# Patient Record
Sex: Male | Born: 1970
Health system: Southern US, Community
[De-identification: ages and names within clinical notes are randomized; demographics above are authoritative.]

## PROBLEM LIST (undated history)

## (undated) DIAGNOSIS — E785 Hyperlipidemia, unspecified: Secondary | ICD-10-CM

## (undated) DIAGNOSIS — E119 Type 2 diabetes mellitus without complications: Secondary | ICD-10-CM

## (undated) DIAGNOSIS — E079 Disorder of thyroid, unspecified: Secondary | ICD-10-CM

## (undated) HISTORY — DX: Type 2 diabetes mellitus without complications: E11.9

## (undated) HISTORY — DX: Hyperlipidemia, unspecified: E78.5

## (undated) HISTORY — DX: Disorder of thyroid, unspecified: E07.9

---

## 2008-04-29 ENCOUNTER — Emergency Department (HOSPITAL_COMMUNITY): Admission: EM | Admit: 2008-04-29 | Discharge: 2008-04-29 | Payer: Self-pay | Admitting: Emergency Medicine

## 2008-05-04 ENCOUNTER — Emergency Department (HOSPITAL_COMMUNITY): Admission: EM | Admit: 2008-05-04 | Discharge: 2008-05-04 | Payer: Self-pay | Admitting: Family Medicine

## 2014-08-24 ENCOUNTER — Ambulatory Visit (INDEPENDENT_AMBULATORY_CARE_PROVIDER_SITE_OTHER): Payer: BC Managed Care – PPO | Admitting: Emergency Medicine

## 2014-08-24 VITALS — BP 126/78 | HR 78 | Temp 98.0°F | Resp 16 | Ht 67.0 in | Wt 175.0 lb

## 2014-08-24 DIAGNOSIS — E785 Hyperlipidemia, unspecified: Secondary | ICD-10-CM

## 2014-08-24 DIAGNOSIS — I10 Essential (primary) hypertension: Secondary | ICD-10-CM

## 2014-08-24 DIAGNOSIS — E119 Type 2 diabetes mellitus without complications: Secondary | ICD-10-CM | POA: Diagnosis not present

## 2014-08-24 DIAGNOSIS — Z8639 Personal history of other endocrine, nutritional and metabolic disease: Secondary | ICD-10-CM | POA: Diagnosis not present

## 2014-08-24 DIAGNOSIS — R7309 Other abnormal glucose: Secondary | ICD-10-CM | POA: Diagnosis not present

## 2014-08-24 DIAGNOSIS — Z23 Encounter for immunization: Secondary | ICD-10-CM | POA: Diagnosis not present

## 2014-08-24 LAB — COMPLETE METABOLIC PANEL WITH GFR
ALT: 47 U/L (ref 0–53)
AST: 34 U/L (ref 0–37)
Albumin: 4.7 g/dL (ref 3.5–5.2)
Alkaline Phosphatase: 97 U/L (ref 39–117)
BILIRUBIN TOTAL: 0.7 mg/dL (ref 0.2–1.2)
BUN: 11 mg/dL (ref 6–23)
CO2: 29 mEq/L (ref 19–32)
CREATININE: 0.76 mg/dL (ref 0.50–1.35)
Calcium: 9.9 mg/dL (ref 8.4–10.5)
Chloride: 100 mEq/L (ref 96–112)
GFR, Est African American: >89 mL/min
GFR, Est Non African American: >89 mL/min
Glucose, Bld: 208 mg/dL — ABNORMAL HIGH (ref 70–99)
Potassium: 4.4 mEq/L (ref 3.5–5.3)
Sodium: 138 mEq/L (ref 135–145)
Total Protein: 8.1 g/dL (ref 6.0–8.3)

## 2014-08-24 LAB — TSH: TSH: 3.628 u[IU]/mL (ref 0.350–4.500)

## 2014-08-24 LAB — GLUCOSE, POCT (MANUAL RESULT ENTRY): POC GLUCOSE: 197 mg/dL — AB (ref 70–99)

## 2014-08-24 LAB — LIPID PANEL
Cholesterol: 157 mg/dL (ref 0–200)
HDL: 31 mg/dL — AB (ref >39)
LDL CALC: 72 mg/dL (ref 0–99)
TRIGLYCERIDES: 269 mg/dL — AB (ref <150)
Total CHOL/HDL Ratio: 5.1 Ratio
VLDL: 54 mg/dL — AB (ref 0–40)

## 2014-08-24 LAB — POCT CBC
GRANULOCYTE PERCENT: 59.3 % (ref 37–80)
HCT, POC: 48.7 % (ref 43.5–53.7)
Hemoglobin: 16.1 g/dL (ref 14.1–18.1)
Lymph, poc: 4 — AB (ref 0.6–3.4)
MCH, POC: 26.3 pg — AB (ref 27–31.2)
MCHC: 33.1 g/dL (ref 31.8–35.4)
MCV: 79.6 fL — AB (ref 80–97)
MID (CBC): 0.6 (ref 0–0.9)
MPV: 8.9 fL (ref 0–99.8)
PLATELET COUNT, POC: 223 10*3/uL (ref 142–424)
POC GRANULOCYTE: 6.8 (ref 2–6.9)
POC LYMPH %: 35 % (ref 10–50)
POC MID %: 5.7 %M (ref 0–12)
RBC: 6.12 M/uL (ref 4.69–6.13)
RDW, POC: 12.8 %
WBC: 11.4 10*3/uL — AB (ref 4.6–10.2)

## 2014-08-24 LAB — POCT GLYCOSYLATED HEMOGLOBIN (HGB A1C): HEMOGLOBIN A1C: 9.6

## 2014-08-24 LAB — T4, FREE: Free T4: 1.07 ng/dL (ref 0.80–1.80)

## 2014-08-24 NOTE — Progress Notes (Addendum)
Subjective:  This chart was scribed for Raymond GobbleSteven A Jatasia Gundrum, MD by Raymond Fitzgerald, ED Scribe. The patient was seen in room 12. Patient's care was started at 12:49 PM.   Patient ID: Raymond Fitzgerald, male    DOB: 1971/04/30, 43 y.o.   MRN: [id number]  Chief Complaint  Patient presents with  . Follow-up  . Diabetes   HPI HPI Comments: Raymond Fitzgerald is a 43 y.o. male, with a h/o DM, hyperlipidemia, thyroid disease, who presents to the [facility] for follow-up. Pt [place] Pt is here to establish care. He states that he last had his A1C checked 3-4 months ago. Pt does not recall the results. He was switched from 15 units of insulin to 20 units 3-4 months ago. Pt had a nerve test done a few months ago that was normal. Pt was given Lyrica; stopped it after 1 month due to behavioral changes. Pt does not express any concerns at this time.  Immunizations Pt wishes to have a flu vaccine today.  Preventative Maintenance Pt last had an eye exam done 1.5 years ago.  Past Medical History  Diagnosis Date  . Diabetes mellitus without complication   . Thyroid disease   . Hyperlipidemia    No current outpatient prescriptions on file prior to visit.   No current facility-administered medications on file prior to visit.   Allergies  Allergen Reactions  . Penicillins Hives and Swelling    Review of Systems  Constitutional: Negative for fever and chills.  Eyes: Negative for visual disturbance.  Respiratory: Negative for shortness of breath.   Cardiovascular: Negative for chest pain.  Neurological: Negative for dizziness and light-headedness.      Objective:   Physical Exam CONSTITUTIONAL: Well developed/well nourished HEAD: Normocephalic/atraumatic EYES: EOMI/PERRL ENMT: Mucous membranes moist NECK: supple no meningeal signs SPINE/BACK:entire spine nontender CV: S1/S2 noted, no murmurs/rubs/gallops noted LUNGS: Lungs  are clear to auscultation bilaterally, no apparent distress ABDOMEN: soft, nontender, no rebound or guarding, bowel sounds noted throughout abdomen GU:no cva tenderness NEURO: Pt is awake/alert/appropriate, moves all extremitiesx4.  No facial droop.   EXTREMITIES: pulses normal/equal, full ROM SKIN: warm, color normal PSYCH: no abnormalities of mood noted, alert and oriented to situation    Results for orders placed or performed in visit on 08/24/14  POCT CBC  Result Value Ref Range   WBC 11.4 (A) 4.6 - 10.2 K/uL   Lymph, poc 4.0 (A) 0.6 - 3.4   POC LYMPH PERCENT 35.0 10 - 50 %L   MID (cbc) 0.6 0 - 0.9   POC MID % 5.7 0 - 12 %M   POC Granulocyte 6.8 2 - 6.9   Granulocyte percent 59.3 37 - 80 %G   RBC 6.12 4.69 - 6.13 M/uL   Hemoglobin 16.1 14.1 - 18.1 g/dL   HCT, POC 40.948.7 81.143.5 - 53.7 %   MCV 79.6 (A) 80 - 97 fL   MCH, POC 26.3 (A) 27 - 31.2 pg   MCHC 33.1 31.8 - 35.4 g/dL   RDW, POC 91.412.8 %   Platelet Count, POC 223 142 - 424 K/uL   MPV 8.9 0 - 99.8 fL  POCT glucose (manual entry)  Result Value Ref Range   POC Glucose 197 (A) 70 - 99 mg/dl  POCT glycosylated hemoglobin (Hb A1C)  Result Value Ref Range   Hemoglobin A1C 9.6    Assessment & Plan:  He will have to continue his  oral medications. I advised him to increase his Lantus from 20 units a day to 25 units a day. Referral has been made to endocrinology. I also advised him to establish wit an ophthalmologist.I personally performed the services described in this documentation, which was scribed in my presence. The recorded information has been reviewed and is accurate.I told him we would be happy to refill his medications until he is established with the endocrinologist.

## 2014-08-27 ENCOUNTER — Telehealth: Payer: Self-pay | Admitting: *Deleted

## 2014-09-15 ENCOUNTER — Encounter: Payer: Self-pay | Admitting: Internal Medicine

## 2014-09-15 ENCOUNTER — Ambulatory Visit (INDEPENDENT_AMBULATORY_CARE_PROVIDER_SITE_OTHER): Payer: BC Managed Care – PPO | Admitting: Internal Medicine

## 2014-09-15 VITALS — BP 126/90 | HR 86 | Temp 98.2°F | Resp 14 | Ht 67.0 in | Wt 179.6 lb

## 2014-09-15 DIAGNOSIS — E1165 Type 2 diabetes mellitus with hyperglycemia: Secondary | ICD-10-CM

## 2014-09-15 DIAGNOSIS — IMO0001 Reserved for inherently not codable concepts without codable children: Secondary | ICD-10-CM

## 2014-09-15 MED ORDER — METFORMIN HCL 1000 MG PO TABS: 1000.0000 mg | ORAL_TABLET | Freq: Two times a day (BID) | ORAL | Status: DC

## 2014-09-15 NOTE — Patient Instructions (Signed)
Please increase Metformin 1000 mg 2x a day with meals. Continue Ameryl 2 mg in am. Increase Januvia to100 mg in am. Continue Lantus 25 units at bedtime.  Please return in 1 month with your sugar log.   PATIENT INSTRUCTIONS FOR TYPE 2 DIABETES:  **Please join MyChart!** - see attached instructions about how to join if you have not done so already.  DIET AND EXERCISE Diet and exercise is an important part of diabetic treatment.  We recommended aerobic exercise in the form of brisk walking (working between 40-60% of maximal aerobic capacity, similar to brisk walking) for 150 minutes per week (such as 30 minutes five days per week) along with 3 times per week performing 'resistance' training (using various gauge rubber tubes with handles) 5-10 exercises involving the major muscle groups (upper body, lower body and core) performing 10-15 repetitions (or near fatigue) each exercise. Start at half the above goal but build slowly to reach the above goals. If limited by weight, joint pain, or disability, we recommend daily walking in a swimming pool with water up to waist to reduce pressure from joints while allow for adequate exercise.    BLOOD GLUCOSES Monitoring your blood glucoses is important for continued management of your diabetes. Please check your blood glucoses 2-4 times a day: fasting, before meals and at bedtime (you can rotate these measurements - e.g. one day check before the 3 meals, the next day check before 2 of the meals and before bedtime, etc.).   HYPOGLYCEMIA (low blood sugar) Hypoglycemia is usually a reaction to not eating, exercising, or taking too much insulin/ other diabetes drugs.  Symptoms include tremors, sweating, hunger, confusion, headache, etc. Treat IMMEDIATELY with 15 grams of Carbs: . 4 glucose tablets .  cup regular juice/soda . 2 tablespoons raisins . 4 teaspoons sugar . 1 tablespoon honey Recheck blood glucose in 15 mins and repeat above if still  symptomatic/blood glucose <100.  RECOMMENDATIONS TO REDUCE YOUR RISK OF DIABETIC COMPLICATIONS: * Take your prescribed MEDICATION(S) * Follow a DIABETIC diet: Complex carbs, fiber rich foods, (monounsaturated and polyunsaturated) fats * AVOID saturated/trans fats, high fat foods, >2,300 mg salt per day. * EXERCISE at least 5 times a week for 30 minutes or preferably daily.  * DO NOT SMOKE OR DRINK more than 1 drink a day. * Check your FEET every day. Do not wear tightfitting shoes. Contact us if you develop an ulcer * See your EYE doctor once a year or more if needed * Get a FLU shot once a year * Get a PNEUMONIA vaccine once before and once after age 43 years  GOALS:  * Your Hemoglobin A1c of <7%  * fasting sugars need to be <130 * after meals sugars need to be <180 (2h after you start eating) * Your Systolic BP should be 140 or lower  * Your Diastolic BP should be 80 or lower  * Your HDL (Good Cholesterol) should be 40 or higher  * Your LDL (Bad Cholesterol) should be 100 or lower. * Your Triglycerides should be 150 or lower  * Your Urine microalbumin (kidney function) should be <30 * Your Body Mass Index should be 25 or lower    Please consider the following ways to cut down carbs and fat and increase fiber and micronutrients in your diet: - substitute whole grain for white bread or pasta - substitute brown rice for white rice - substitute 90-calorie flat bread pieces for slices of bread when possible - substitute sweet  potatoes or yams for white potatoes - substitute humus for margarine - substitute tofu for cheese when possible - substitute almond or rice milk for regular milk (would not drink soy milk daily due to concern for soy estrogen influence on breast cancer risk) - substitute dark chocolate for other sweets when possible - substitute water - can add lemon or orange slices for taste - for diet sodas (artificial sweeteners will trick your body that you can eat sweets  without getting calories and will lead you to overeating and weight gain in the long run) - do not skip breakfast or other meals (this will slow down the metabolism and will result in more weight gain over time)  - can try smoothies made from fruit and almond/rice milk in am instead of regular breakfast - can also try old-fashioned (not instant) oatmeal made with almond/rice milk in am - order the dressing on the side when eating salad at a restaurant (pour less than half of the dressing on the salad) - eat as little meat as possible - can try juicing, but should not forget that juicing will get rid of the fiber, so would alternate with eating raw veg./fruits or drinking smoothies - use as little oil as possible, even when using olive oil - can dress a salad with a mix of balsamic vinegar and lemon juice, for e.g. - use agave nectar, stevia sugar, or regular sugar rather than artificial sweateners - steam or broil/roast veggies  - snack on veggies/fruit/nuts (unsalted, preferably) when possible, rather than processed foods - reduce or eliminate aspartame in diet (it is in diet sodas, chewing gum, etc) Read the labels!  Try to read Dr. Janene Harvey book: "Program for Reversing Diabetes" for other ideas for healthy eating.

## 2014-09-15 NOTE — Progress Notes (Signed)
Patient ID: Raymond Fitzgerald, male   DOB: 1971/06/26, 43 y.o.   MRN: [id number]  HPI: Raymond Fitzgerald is a 43 y.o.-year-old male, referred by his PCP, Dr. Cleta Albertsaub, for management of DM2, dx 2011, insulin-dependent since 2014, uncontrolled, without complications. [facility] [place] [facility] [place]  Last hemoglobin A1c was: Lab Results  Component Value Date   HGBA1C 9.6 08/24/2014  05/2014: HbA1c 8.5%  Pt is on a regimen of: - Metformin 850 mg po bid >> tolerates it well - Amaryl 2 mg in am  - Januvia 50 mg daily (for 2-3 years) - Lantus 20 >> 25 units qhs (in last 2 weeks)  Pt does not check his sugars - frustrated b/c all in the 200s - am: n/c - 2h after b'fast: n/c - before lunch: n/c - 2h after lunch: n/c - before dinner: n/c - 2h after dinner: n/c - bedtime: n/c - nighttime: n/c No lows. Lowest sugar was 150; he has hypoglycemia awareness at 70.  Highest sugar was 297 - long time ago.  Pt's meals are: - Breakfast: tea (unsweet) + cookies  - Lunch: tortilla + vegetables, chicken/fish/seafood - Dinner: same - Snacks: 2 : protein bars mid-pm; post dinner: fruit, bar  - no CKD, last BUN/creatinine:  Lab Results  Component Value Date   BUN 11 08/24/2014   CREATININE 0.76 08/24/2014   - last set of lipids: Lab Results  Component Value Date   CHOL 157 08/24/2014   HDL 31* 08/24/2014   LDLCALC 72 08/24/2014   TRIG 269* 08/24/2014   CHOLHDL 5.1 08/24/2014  On Atorvastatin 20. - last eye exam was in 2014. No DR. New appt 10/02/2014. - no numbness and tingling in his feet.  Pt has FH of DM in M, F.  ROS: Constitutional: no weight gain/loss, + increased appetite, no fatigue, no subjective hyperthermia/hypothermia Eyes: no blurry vision, no xerophthalmia ENT: no sore throat, no nodules palpated in throat, no dysphagia/odynophagia, no hoarseness Cardiovascular: no CP/SOB/palpitations/leg swelling Respiratory: no cough/SOB Gastrointestinal: no  N/V/D/C Musculoskeletal: no muscle/joint aches Skin: no rashes Neurological: no tremors/numbness/tingling/dizziness Psychiatric: no depression/anxiety  Past Medical History  Diagnosis Date  . Diabetes mellitus without complication   . Thyroid disease   . Hyperlipidemia    No past surgical history on file. History   Social History  . Marital Status: Married    Spouse Name: N/A    Number of Children: 2   Occupational History  . Sales assistant   Social History Main Topics  . Smoking status: Never Smoker   . Smokeless tobacco: Not on file  . Alcohol Use: Yes  . Drug Use: No   Current Outpatient Prescriptions on File Prior to Visit  Medication Sig Dispense Refill  . atorvastatin (LIPITOR) 20 MG tablet Take 20 mg by mouth daily.    Marland Kitchen. glimepiride (AMARYL) 2 MG tablet Take 2 mg by mouth daily with breakfast.    . levothyroxine (SYNTHROID, LEVOTHROID) 50 MCG tablet Take 50 mcg by mouth daily before breakfast.    . sitaGLIPtin (JANUVIA) 50 MG tablet Take 50 mg by mouth daily.     Metformin 850 mg 2x a day  Allergies  Allergen Reactions  . Penicillins Hives and Swelling   FH: - see HPI - HL in M, F - cancer in M  PE: BP 126/90 mmHg  Pulse 86  Temp(Src) 98.2 F (36.8 C) (Oral)  Resp 14  Ht 5\' 7"  (1.702 m)  Wt 179 lb 9.6 oz (81.466 kg)  BMI 28.12 kg/m2  SpO2 97% Wt Readings from Last 3 Encounters:  09/15/14 179 lb 9.6 oz (81.466 kg)  08/24/14 175 lb (79.379 kg)   Constitutional: overweight, in NAD Eyes: PERRLA, EOMI, no exophthalmos ENT: moist mucous membranes, no thyromegaly, no cervical lymphadenopathy Cardiovascular: RRR, No MRG Respiratory: CTA B Gastrointestinal: abdomen soft, NT, ND, BS+ Musculoskeletal: no deformities, strength intact in all 4 Skin: moist, warm, no rashes Neurological: no tremor with outstretched hands, DTR normal in all 4  ASSESSMENT: 1. DM2, insulin-dependent, uncontrolled, without complications  PLAN:  1. Patient with  uncontrolled diabetes, on oral antidiabetic regimen + basal insulin, which became insufficient - We discussed about options for treatment, but it is difficult to make changes w/o a sugar log...  I suggested to:  Patient Instructions  Please increase Metformin 1000 mg 2x a day with meals. Continue Ameryl 2 mg in am. Increase Januvia to100 mg in am. Continue Lantus 25 units at bedtime.  Please return in 1 month with your sugar log.   - Strongly advised him to start checking sugars at different times of the day - check 2 times a day, rotating checks - given sugar log and advised how to fill it and to bring it at next appt  - given foot care handout and explained the principles  - given instructions for hypoglycemia management "15-15 rule"  - advised for yearly eye exams >> has one schedule - Return to clinic in 1 mo with sugar log

## 2014-10-03 ENCOUNTER — Telehealth: Payer: Self-pay | Admitting: Internal Medicine

## 2014-10-03 ENCOUNTER — Other Ambulatory Visit: Payer: Self-pay | Admitting: *Deleted

## 2014-10-03 MED ORDER — GLIMEPIRIDE 2 MG PO TABS: 2.0000 mg | ORAL_TABLET | Freq: Every day | ORAL | Status: DC

## 2014-10-03 MED ORDER — LEVOTHYROXINE SODIUM 50 MCG PO TABS: 50.0000 ug | ORAL_TABLET | Freq: Every day | ORAL | Status: DC

## 2014-10-03 MED ORDER — ATORVASTATIN CALCIUM 20 MG PO TABS: 20.0000 mg | ORAL_TABLET | Freq: Every day | ORAL | Status: DC

## 2014-10-03 MED ORDER — SITAGLIPTIN PHOSPHATE 50 MG PO TABS: 100.0000 mg | ORAL_TABLET | Freq: Every day | ORAL | Status: DC

## 2014-10-03 MED ORDER — SITAGLIPTIN PHOSPHATE 100 MG PO TABS: 100.0000 mg | ORAL_TABLET | Freq: Every day | ORAL | Status: DC

## 2014-10-03 MED ORDER — METFORMIN HCL 1000 MG PO TABS: 1000.0000 mg | ORAL_TABLET | Freq: Two times a day (BID) | ORAL | Status: DC

## 2014-10-03 NOTE — Telephone Encounter (Signed)
Patient states that due to the cost if he could please have the Januvia as take one 100mg  tab   Pharmacy: [facility]   Please advise   Thank you

## 2014-10-03 NOTE — Telephone Encounter (Signed)
Pt called stating that due to the cost if he could please have the Januvia as take one 100mg  tab. Sent new rx for Januvia 100mg  to pts pharmacy.

## 2014-10-03 NOTE — Telephone Encounter (Signed)
ok 

## 2014-10-03 NOTE — Telephone Encounter (Signed)
Please read note below. Ok to refill all meds?

## 2014-10-03 NOTE — Telephone Encounter (Signed)
Refills sent to [facility] [address].

## 2014-10-03 NOTE — Telephone Encounter (Signed)
New rx for Januvia 100 mg sent to [facility].

## 2014-10-03 NOTE — Telephone Encounter (Signed)
Pt needs all meds called in except the lantus.  [facility] [address]

## 2014-10-03 NOTE — Addendum Note (Signed)
Addended by: Adline MangoALLICUTT, Seanna Sisler B on: 10/03/2014 01:43 PM   Modules accepted: Orders

## 2014-10-10 ENCOUNTER — Telehealth: Payer: Self-pay | Admitting: Internal Medicine

## 2014-10-10 NOTE — Telephone Encounter (Signed)
We are working on the PA

## 2014-10-10 NOTE — Telephone Encounter (Signed)
The RX for Januvia needs PA.  # [phone]

## 2014-10-11 ENCOUNTER — Other Ambulatory Visit: Payer: Self-pay | Admitting: *Deleted

## 2014-10-11 MED ORDER — LINAGLIPTIN 5 MG PO TABS: 5.0000 mg | ORAL_TABLET | Freq: Every day | ORAL | Status: DC

## 2014-10-26 ENCOUNTER — Telehealth: Payer: Self-pay | Admitting: *Deleted

## 2014-10-26 ENCOUNTER — Telehealth: Payer: Self-pay | Admitting: Internal Medicine

## 2014-10-26 ENCOUNTER — Other Ambulatory Visit: Payer: Self-pay | Admitting: *Deleted

## 2014-10-26 ENCOUNTER — Ambulatory Visit: Payer: 59 | Admitting: Internal Medicine

## 2014-10-26 MED ORDER — METFORMIN HCL 1000 MG PO TABS: 1000.0000 mg | ORAL_TABLET | Freq: Two times a day (BID) | ORAL | Status: DC

## 2014-10-26 MED ORDER — GLIMEPIRIDE 2 MG PO TABS: 2.0000 mg | ORAL_TABLET | Freq: Every day | ORAL | Status: DC

## 2014-10-26 NOTE — Telephone Encounter (Signed)
Pt had appt today but has no PCP and we have no auth to see him. He has the new [employer] insurance and this is required. Pt does need all rx refilled the Amaryl is 2 per day. Metformin rx needs to be 2 tabs daily. Doesn't need tradjenta

## 2014-10-26 NOTE — Telephone Encounter (Signed)
Patient stated he went to the pharmacy to pick up his prescription Amaryl it was for 1 tab a day when he normally get 2 tabs a day. Please advise

## 2014-10-26 NOTE — Telephone Encounter (Signed)
Pt needs 90 day rx on lantus, tradjenta, synthroid, lipitor. [facility]. °

## 2014-10-27 MED ORDER — GLIMEPIRIDE 2 MG PO TABS: 4.0000 mg | ORAL_TABLET | Freq: Every day | ORAL | Status: DC

## 2014-10-27 NOTE — Telephone Encounter (Signed)
Please read note below. I looked through pt's chart and I did not see that he was to be taking 2 tabs a day of Amaryl. Please advise.

## 2014-10-27 NOTE — Telephone Encounter (Signed)
Per my records, he was only taking 2 mg a day (1 tab) - is he taking more?? If so, let's call the higher dose in if no lows.

## 2014-10-31 ENCOUNTER — Telehealth: Payer: Self-pay | Admitting: Internal Medicine

## 2014-10-31 ENCOUNTER — Other Ambulatory Visit: Payer: Self-pay | Admitting: *Deleted

## 2014-10-31 MED ORDER — LINAGLIPTIN 5 MG PO TABS: 5.0000 mg | ORAL_TABLET | Freq: Every day | ORAL | Status: DC

## 2014-10-31 MED ORDER — LEVOTHYROXINE SODIUM 50 MCG PO TABS: 50.0000 ug | ORAL_TABLET | Freq: Every day | ORAL | Status: DC

## 2014-10-31 MED ORDER — INSULIN GLARGINE 100 UNIT/ML SOLOSTAR PEN: 25.0000 [IU] | PEN_INJECTOR | Freq: Every day | SUBCUTANEOUS | Status: DC

## 2014-10-31 MED ORDER — ATORVASTATIN CALCIUM 20 MG PO TABS: 20.0000 mg | ORAL_TABLET | Freq: Every day | ORAL | Status: DC

## 2014-10-31 NOTE — Telephone Encounter (Signed)
Patient states that 4 of his Rx's have not been called into the pharmacy  Synthroid Metformin Lantus  Lipitor  Pharmacy: [facility]   Please advise   Thank you

## 2014-10-31 NOTE — Telephone Encounter (Signed)
Done

## 2014-10-31 NOTE — Telephone Encounter (Signed)
Please advise if ok to refill for 90 days.

## 2014-10-31 NOTE — Telephone Encounter (Signed)
Pt needs 90 day rx on lantus, tradjenta, synthroid, lipitor. [facility].

## 2014-10-31 NOTE — Telephone Encounter (Signed)
Message sent to Dr Elvera LennoxGherghe to see if she will ok refills of his medications.

## 2014-10-31 NOTE — Telephone Encounter (Signed)
OK, but with 0 refills.

## 2014-12-13 ENCOUNTER — Ambulatory Visit: Payer: 59 | Admitting: Internal Medicine

## 2015-01-11 ENCOUNTER — Ambulatory Visit (INDEPENDENT_AMBULATORY_CARE_PROVIDER_SITE_OTHER): Payer: 59 | Admitting: Internal Medicine

## 2015-01-11 ENCOUNTER — Other Ambulatory Visit: Payer: Self-pay | Admitting: *Deleted

## 2015-01-11 ENCOUNTER — Encounter: Payer: Self-pay | Admitting: Internal Medicine

## 2015-01-11 VITALS — BP 112/62 | HR 84 | Temp 98.2°F | Resp 12 | Wt 178.0 lb

## 2015-01-11 DIAGNOSIS — R21 Rash and other nonspecific skin eruption: Secondary | ICD-10-CM

## 2015-01-11 DIAGNOSIS — E1165 Type 2 diabetes mellitus with hyperglycemia: Secondary | ICD-10-CM

## 2015-01-11 DIAGNOSIS — IMO0001 Reserved for inherently not codable concepts without codable children: Secondary | ICD-10-CM

## 2015-01-11 MED ORDER — LINAGLIPTIN 5 MG PO TABS: 5.0000 mg | ORAL_TABLET | Freq: Every day | ORAL | Status: DC

## 2015-01-11 MED ORDER — METFORMIN HCL 1000 MG PO TABS: 1000.0000 mg | ORAL_TABLET | Freq: Two times a day (BID) | ORAL | Status: DC

## 2015-01-11 MED ORDER — INSULIN PEN NEEDLE 32G X 4 MM MISC
Status: DC
Start: 2015-01-11 — End: 2015-08-16

## 2015-01-11 MED ORDER — GLIMEPIRIDE 2 MG PO TABS: 2.0000 mg | ORAL_TABLET | Freq: Two times a day (BID) | ORAL | Status: DC

## 2015-01-11 MED ORDER — ONETOUCH DELICA LANCETS FINE MISC
Status: DC
Start: 2015-01-11 — End: 2015-10-03

## 2015-01-11 MED ORDER — INSULIN GLARGINE 300 UNIT/ML ~~LOC~~ SOPN: 30.0000 [IU] | PEN_INJECTOR | Freq: Every day | SUBCUTANEOUS | Status: DC

## 2015-01-11 MED ORDER — GLUCOSE BLOOD VI STRP
ORAL_STRIP | Status: DC
Start: 2015-01-11 — End: 2015-08-16

## 2015-01-11 NOTE — Patient Instructions (Addendum)
Patient Instructions  Please continue Metformin 1000 mg 2x a day with meals. Move Amaryl 2 mg 2x a day to15 min before meals  Continue Tradjenta 5 mg in am. Stop Lantus and start Toujeo 30 units at bedtime.  Please return in 1.5 month with your sugar log.

## 2015-01-11 NOTE — Progress Notes (Signed)
Patient ID: Raymond Fitzgerald, male   DOB: [birth year], 44 y.o.   MRN: [id number]  HPI: Raymond Fitzgerald is a 44 y.o.-year-old male, initially referred by his PCP, Dr. Cleta Alberts, for management of DM2, dx 2011, insulin-dependent since 2014, uncontrolled, without complications. He recently moved from [facility] [place] ([place]). New PCP: Dr. Pershing Proud. Last visit 4 mo ago.  He started to exercise 10-15 min in am and in the evening: elliptical.  Last hemoglobin A1c was: 10/2014: HbA1c 9.5% Lab Results  Component Value Date   HGBA1C 9.6 08/24/2014  05/2014: HbA1c 8.5%  Pt is on a regimen of: - Metformin 850 >> 1000 mg po bid >> tolerates it well - Amaryl 2 mg 2x a day - after meals! - Januvia 50 >> 100 mg daily (for 2-3 years) >> Tradjenta 5 mg in am - Lantus 25 units qhs   Pt checks his sugars 0-2x a day: - am: n/c >> 143-195 - 2h after b'fast: n/c >> 278 - before lunch: n/c >> 183, 184 - 2h after lunch: n/c >> 183 - before dinner: n/c >> 126-216, 239 - 2h after dinner: n/c >> 200-211 - bedtime: n/c - nighttime: n/c No lows. Lowest sugar was 150 >> 143; he has hypoglycemia awareness at 70.  Highest sugar was 297 >> 278x1  Meter: GE (not covered by [facility])  Pt's meals are: - Breakfast: tea (unsweet) + cookies  - Lunch: tortilla + vegetables, chicken/fish/seafood - Dinner: same - Snacks: 2 : protein bars mid-pm; post dinner: fruit, bar  - no CKD, last BUN/creatinine:  Lab Results  Component Value Date   BUN 11 08/24/2014   CREATININE 0.76 08/24/2014   - last set of lipids: Lab Results  Component Value Date   CHOL 157 08/24/2014   HDL 31* 08/24/2014   LDLCALC 72 08/24/2014   TRIG 269* 08/24/2014   CHOLHDL 5.1 08/24/2014  On Atorvastatin 20. - last eye exam was in 2014. No DR. New appt 10/02/2014. - no numbness and tingling in his feet.  He has hypothyroidism.  Last TSH: 4.54. He is on LT4 50 mcg: - in am - fasting - with water - 30 min before breakfast - takes Centrum at  night  ROS: Constitutional: no weight gain/loss, no increased appetite, no fatigue, no subjective hyperthermia/hypothermia Eyes: no blurry vision, no xerophthalmia ENT: no sore throat, no nodules palpated in throat, no dysphagia/odynophagia, no hoarseness Cardiovascular: no CP/SOB/palpitations/leg swelling Respiratory: no cough/SOB Gastrointestinal: no N/V/D/C Musculoskeletal: no muscle/joint aches Skin: + rash on arms, itching Neurological: no tremors/numbness/tingling/dizziness  I reviewed pt's medications, allergies, PMH, social hx, family hx, and changes were documented in the history of present illness. Otherwise, unchanged from my initial visit note:  Past Medical History  Diagnosis Date  . Diabetes mellitus without complication   . Thyroid disease   . Hyperlipidemia    No past surgical history on file. History   Social History  . Marital Status: Married    Spouse Name: N/A    Number of Children: 2   Occupational History  . Sales assistant   Social History Main Topics  . Smoking status: Never Smoker   . Smokeless tobacco: Not on file  . Alcohol Use: Yes  . Drug Use: No   Current Outpatient Prescriptions on File Prior to Visit  Medication Sig Dispense Refill  . atorvastatin (LIPITOR) 20 MG tablet Take 20 mg by mouth daily.    Marland Kitchen glimepiride (AMARYL) 2 MG tablet Take 2 mg by mouth daily with breakfast.    .  levothyroxine (SYNTHROID, LEVOTHROID) 50 MCG tablet Take 50 mcg by mouth daily before breakfast.    . sitaGLIPtin (JANUVIA) 50 MG tablet Take 50 mg by mouth daily.     Metformin 850 mg 2x a day  Allergies  Allergen Reactions  . Penicillins Hives and Swelling   FH: - see HPI - HL in M, F - cancer in M  PE: BP 112/62 mmHg  Pulse 84  Temp(Src) 98.2 F (36.8 C) (Oral)  Resp 12  Wt 178 lb (80.74 kg)  SpO2 97% Wt Readings from Last 3 Encounters:  01/11/15 178 lb (80.74 kg)  09/15/14 179 lb 9.6 oz (81.466 kg)  08/24/14 175 lb (79.379 kg)    Constitutional: overweight, in NAD Eyes: PERRLA, EOMI, no exophthalmos ENT: moist mucous membranes, no thyromegaly, no cervical lymphadenopathy Cardiovascular: RRR, No MRG Respiratory: CTA B Gastrointestinal: abdomen soft, NT, ND, BS+ Musculoskeletal: no deformities, strength intact in all 4 Skin: moist, warm, + papular rash on upper arms - + excoriation from scratching, ~ 1.5 cm in size Neurological: no tremor with outstretched hands, DTR normal in all 4  ASSESSMENT: 1. DM2, insulin-dependent, uncontrolled, without complications  2. Rash on arms  PLAN:  1. Patient with uncontrolled diabetes, on oral antidiabetic regimen + basal insulin, with still suboptimal control. He is taking the Amaryl after b'fast and at bedtime >> advised to move it before meals. I also suggested to increase Lantus by 5 units and switch to Toujeo - given coupon card:  Patient Instructions  Please continue Metformin 1000 mg 2x a day with meals. Move Amaryl 2 mg 2x a day to15 min before meals  Continue Tradjenta 5 mg in am. Stop Lantus and start Toujeo 30 units at bedtime.  Please return in 1.5 month with your sugar log.   - continue checking sugars at different times of the day - check 2 times a day, rotating checks - given new meter covered by insurance - [employer] - refilled all DM meds - advised for yearly eye exams >> he is UTD - given more sugar log sheets - Return to clinic in 1.5 mo with sugar log   2. Rash - on upper arms - tried Nystatin cream >> did not help - tried coconut oil >> helped with itching - recommended Anti-itch - if not resolving >> may need derm referral  Did not stop at the lab for a HbA1c >> will check at next visit.

## 2015-02-22 ENCOUNTER — Ambulatory Visit: Payer: Self-pay | Admitting: Internal Medicine

## 2015-03-14 ENCOUNTER — Ambulatory Visit (INDEPENDENT_AMBULATORY_CARE_PROVIDER_SITE_OTHER): Payer: 59 | Admitting: Internal Medicine

## 2015-03-14 ENCOUNTER — Encounter: Payer: Self-pay | Admitting: Internal Medicine

## 2015-03-14 VITALS — BP 114/70 | HR 92 | Temp 98.1°F | Resp 12 | Wt 179.0 lb

## 2015-03-14 DIAGNOSIS — E1165 Type 2 diabetes mellitus with hyperglycemia: Secondary | ICD-10-CM

## 2015-03-14 DIAGNOSIS — IMO0001 Reserved for inherently not codable concepts without codable children: Secondary | ICD-10-CM

## 2015-03-14 LAB — HEMOGLOBIN A1C: HEMOGLOBIN A1C: 8.6 % — AB (ref 4.6–6.5)

## 2015-03-14 MED ORDER — GLIPIZIDE 5 MG PO TABS: 5.0000 mg | ORAL_TABLET | Freq: Two times a day (BID) | ORAL | Status: DC

## 2015-03-14 MED ORDER — INSULIN GLARGINE 300 UNIT/ML ~~LOC~~ SOPN: 30.0000 [IU] | PEN_INJECTOR | Freq: Every day | SUBCUTANEOUS | Status: DC

## 2015-03-14 MED ORDER — METFORMIN HCL 1000 MG PO TABS: 1000.0000 mg | ORAL_TABLET | Freq: Two times a day (BID) | ORAL | Status: DC

## 2015-03-14 MED ORDER — ATORVASTATIN CALCIUM 20 MG PO TABS: 20.0000 mg | ORAL_TABLET | Freq: Every day | ORAL | Status: DC

## 2015-03-14 MED ORDER — DULAGLUTIDE 1.5 MG/0.5ML ~~LOC~~ SOAJ: SUBCUTANEOUS | Status: DC

## 2015-03-14 NOTE — Progress Notes (Signed)
Patient ID: Raymond Fitzgerald, male   DOB: [birth year], 44 y.o.   MRN: [id number]  HPI: Raymond Fitzgerald is a 44 y.o.-year-old male, initially referred by his PCP, Dr. Cleta Albertsaub, for management of DM2, dx 2011, insulin-dependent since 2014, uncontrolled, without complications. [place] ([place]). New PCP: Dr. Pershing ProudWilliam Dwight. Last visit 2 mo ago.  Last hemoglobin A1c was: 10/2014: HbA1c 9.5% Lab Results  Component Value Date   HGBA1C 9.6 08/24/2014  05/2014: HbA1c 8.5%  Pt is on a regimen of: - Metformin 850 >> 1000 mg po bid >> tolerates it well - Amaryl 2 mg 2x a day - Januvia 50 >> 100 mg daily (for 2-3 years) >> Tradjenta 5 mg in am - Lantus 25 >> 30 units qhs. Did not switch to Toujeo  Pt checks his sugars 0-2x a day - he tells me they are better, but per log, they are higher: - am: n/c >> 143-195 >> 130-190 - 2h after b'fast: n/c >> 278 >> 203 - before lunch: n/c >> 183, 184 >> 236 - 2h after lunch: n/c >> 183 >> 201 - before dinner: n/c >> 126-216, 239 >> 157-231 - 2h after dinner: n/c >> 200-211 >> 230-250 - bedtime: n/c - nighttime: n/c No lows. Lowest sugar was 150 >> 143; he has hypoglycemia awareness at 70.  Highest sugar was 297 >> 278x1>> 230-250 with carbs.  Meter: GE ([facility]  Started walking.   Pt's meals are: - Breakfast: tea (unsweet) + cookies  - Lunch: tortilla + vegetables, chicken/fish/seafood - Dinner: same - Snacks: 2 : protein bars mid-pm; post dinner: fruit, bar  - no CKD, last BUN/creatinine:  Lab Results  Component Value Date   BUN 11 08/24/2014   CREATININE 0.76 08/24/2014   - last set of lipids: Lab Results  Component Value Date   CHOL 157 08/24/2014   HDL 31* 08/24/2014   LDLCALC 72 08/24/2014   TRIG 269* 08/24/2014   CHOLHDL 5.1 08/24/2014  On Atorvastatin 20. - last eye exam was in 2014. No DR. New appt 10/02/2014. - no numbness and tingling in his feet.  He has hypothyroidism.  Last TSH: 4.54. He  is on LT4 50 mcg: - in am - fasting - with water - 30 min before breakfast - takes Centrum at night  ROS: Constitutional: no weight gain/loss, no increased appetite, no fatigue, no subjective hyperthermia/hypothermia Eyes: no blurry vision, no xerophthalmia ENT: no sore throat, no nodules palpated in throat, no dysphagia/odynophagia, no hoarseness Cardiovascular: no CP/SOB/palpitations/leg swelling Respiratory: no cough/SOB Gastrointestinal: no N/V/D/C Musculoskeletal: no muscle/joint aches Skin: + rash on arms, itching Neurological: no tremors/numbness/tingling/dizziness  I reviewed pt's medications, allergies, PMH, social hx, family hx, and changes were documented in the history of present illness. Otherwise, unchanged from my initial visit note:  Past Medical History  Diagnosis Date  . Diabetes mellitus without complication   . Thyroid disease   . Hyperlipidemia    No past surgical history on file. History   Social History  . Marital Status: Married    Spouse Name: N/A    Number of Children: 2   Occupational History  . Sales assistant   Social History Main Topics  . Smoking status: Never Smoker   . Smokeless tobacco: Not on file  . Alcohol Use: Yes  . Drug Use: No   Current Outpatient Prescriptions on File Prior to Visit  Medication Sig Dispense Refill  . atorvastatin (LIPITOR) 20 MG tablet Take 20  mg by mouth daily.    Marland Kitchen glimepiride (AMARYL) 2 MG tablet Take 2 mg by mouth daily with breakfast.    . levothyroxine (SYNTHROID, LEVOTHROID) 50 MCG tablet Take 50 mcg by mouth daily before breakfast.    . sitaGLIPtin (JANUVIA) 50 MG tablet Take 50 mg by mouth daily.     Metformin 850 mg 2x a day  Allergies  Allergen Reactions  . Penicillins Hives and Swelling   FH: - see HPI - HL in M, F - cancer in M  PE: BP 114/70 mmHg  Pulse 92  Temp(Src) 98.1 F (36.7 C) (Oral)  Resp 12  Wt 179 lb (81.194 kg)  SpO2 97% Wt Readings from Last 3 Encounters:   03/14/15 179 lb (81.194 kg)  01/11/15 178 lb (80.74 kg)  09/15/14 179 lb 9.6 oz (81.466 kg)   Constitutional: overweight, in NAD Eyes: PERRLA, EOMI, no exophthalmos ENT: moist mucous membranes, no thyromegaly, no cervical lymphadenopathy Cardiovascular: RRR, No MRG Respiratory: CTA B Gastrointestinal: abdomen soft, NT, ND, BS+ Musculoskeletal: no deformities, strength intact in all 4 Skin: moist, warm, + papular rash on upper arms - + excoriation from scratching, ~ 1.5 cm in size Neurological: no tremor with outstretched hands, DTR normal in all 4  ASSESSMENT: 1. DM2, insulin-dependent, uncontrolled, without complications  PLAN:  1. Patient with uncontrolled diabetes, on oral antidiabetic regimen + basal insulin, with still suboptimal control. Will add Toujeo and stop Tradjenta, and switch from Amaryl to Glipizide:  Patient Instructions  Please continue: - Metformin 1000 mg 2x a day with meal - Lantus 30 units at bedtime  Please stop: - Amaryl - Tradjenta  Please start: - Glipizide 5 mg 2x a day before meals (increase to 10 mg if you have a large meal) - Trulicity 1.5 mg once a week  Please stop at the lab.  Please come back for a follow-up appointment in 3 months.  - continue checking sugars at different times of the day - check 2 times a day, rotating checks - refilled all DM meds - advised for yearly eye exams >> he is UTD - given more sugar log sheets - Return to clinic in 3 mo with sugar log   Office Visit on 03/14/2015  Component Date Value Ref Range Status  . Hgb A1c MFr Bld 03/14/2015 8.6* 4.6 - 6.5 % Final   Glycemic Control Guidelines for People with Diabetes:Non Diabetic:  <6%Goal of Therapy: <7%Additional Action Suggested:  >8%    HbA1c a little better.

## 2015-03-14 NOTE — Patient Instructions (Addendum)
Please continue: - Metformin 1000 mg 2x a day with meal - Lantus 30 units at bedtime  Please stop: - Amaryl - Tradjenta  Please start: - Glipizide 10 mg 2x a day before meals - Trulicity 1.5 mg once a week  Please stop at the lab.  Please come back for a follow-up appointment in 3 months

## 2015-03-17 ENCOUNTER — Telehealth: Payer: Self-pay | Admitting: *Deleted

## 2015-03-20 NOTE — Telephone Encounter (Signed)
Needed to speak with MA

## 2015-03-23 ENCOUNTER — Telehealth: Payer: Self-pay | Admitting: *Deleted

## 2015-03-23 NOTE — Telephone Encounter (Signed)
Pt's insurance will not cover Trulicity. They are requiring a PA for this. Can you please review her last note and advise what pt should do until we get either an approval or denial for the Trulicity. Thank you.

## 2015-03-23 NOTE — Telephone Encounter (Signed)
Need to find out what the alternative insurance coverage will be for

## 2015-03-24 MED ORDER — EXENATIDE ER 2 MG ~~LOC~~ PEN: 2.0000 mg | PEN_INJECTOR | SUBCUTANEOUS | Status: DC

## 2015-03-24 NOTE — Telephone Encounter (Signed)
Check coverage for Victoza or Bydureon

## 2015-03-24 NOTE — Telephone Encounter (Signed)
Yes to both.

## 2015-03-24 NOTE — Telephone Encounter (Signed)
I would prefer that he see Bonita Quin to discuss both the medications and see which one he prefers as well as be instructed on dosage titration with Victoza if he wants this with a smaller needle and daily injection

## 2015-03-24 NOTE — Telephone Encounter (Signed)
I called pt and he said that he prefers the Bydureon. He said he prefers a once a week injection. Pt would like to start this weekend. Please advise.

## 2015-03-24 NOTE — Telephone Encounter (Signed)
Called pt and advised him per Dr Remus Blake message. Pt will call ins co to see if they cover either Victoza or Bydureon and let us know.

## 2015-03-24 NOTE — Telephone Encounter (Signed)
Called pt and advised him. He voiced understanding.

## 2015-03-24 NOTE — Telephone Encounter (Signed)
We can call in the prescription but he may not be able to understand the mixing of the medication which is more difficult than Trulicity that is why he needs to see Bonita Quin

## 2015-03-24 NOTE — Telephone Encounter (Signed)
Pt called back and advised both Victoza and Bydureon are approved by his ins co. Advise and I will send to pt's pharmacy [employer]). Thank you.

## 2015-03-24 NOTE — Telephone Encounter (Signed)
Can the pharmacist educate him on mixing it? 2mg /once weekly, correct? Please advise.

## 2015-03-24 NOTE — Telephone Encounter (Signed)
Alternative is Lantus or Levemir. Pt has been on Lantus. Pt was advised to stop Amaryl and Tradjenta, start glipized and Trulicity by Dr Elvera Lennox.  Please advise if pt needs to restart on of his previous medications.Also, if pt should continue on Lantus until he sees Dr Elvera Lennox at his next OV? Thank you.

## 2015-05-15 ENCOUNTER — Other Ambulatory Visit: Payer: Self-pay | Admitting: *Deleted

## 2015-05-15 MED ORDER — ATORVASTATIN CALCIUM 20 MG PO TABS: 20.0000 mg | ORAL_TABLET | Freq: Every day | ORAL | Status: DC

## 2015-05-16 ENCOUNTER — Ambulatory Visit: Payer: Self-pay | Admitting: Internal Medicine

## 2015-06-13 ENCOUNTER — Other Ambulatory Visit: Payer: Self-pay | Admitting: Endocrinology

## 2015-06-13 NOTE — Telephone Encounter (Signed)
This is your patient Raymond Fitzgerald

## 2015-06-27 ENCOUNTER — Ambulatory Visit: Payer: Self-pay | Admitting: Internal Medicine

## 2015-07-12 ENCOUNTER — Other Ambulatory Visit: Payer: Self-pay | Admitting: *Deleted

## 2015-07-12 ENCOUNTER — Telehealth: Payer: Self-pay | Admitting: Internal Medicine

## 2015-07-12 MED ORDER — GLIPIZIDE 5 MG PO TABS: 5.0000 mg | ORAL_TABLET | Freq: Two times a day (BID) | ORAL | Status: DC

## 2015-07-12 NOTE — Telephone Encounter (Signed)
Patient that he need a prior Auth for medication, It was changed from Lantus to another medication he do not know the name of the medication is

## 2015-07-13 NOTE — Telephone Encounter (Signed)
Working on [employer] for [employer].

## 2015-07-14 NOTE — Telephone Encounter (Signed)
Called pt and advised him per Dr Charlean Sanfilippo note to stay on the Lantus since his ins co will not cover it. Pt questioned should he go back on Tradjenta? Should he make any other medication changes not what he is not going to take the Toujeo? Please advise.

## 2015-07-14 NOTE — Telephone Encounter (Signed)
Called pt and advised him per Dr Gherghe's message. Pt voiced understanding.  

## 2015-07-14 NOTE — Telephone Encounter (Signed)
No, as he is already on Bydureon and this is in the same class with Tradjenta, but stronger. No need for other changes.

## 2015-08-02 ENCOUNTER — Ambulatory Visit (INDEPENDENT_AMBULATORY_CARE_PROVIDER_SITE_OTHER): Payer: 59 | Admitting: Internal Medicine

## 2015-08-02 ENCOUNTER — Other Ambulatory Visit (INDEPENDENT_AMBULATORY_CARE_PROVIDER_SITE_OTHER): Payer: 59 | Admitting: *Deleted

## 2015-08-02 VITALS — BP 108/64 | HR 86 | Temp 98.1°F | Resp 12 | Wt 174.8 lb

## 2015-08-02 DIAGNOSIS — Z794 Long term (current) use of insulin: Secondary | ICD-10-CM | POA: Diagnosis not present

## 2015-08-02 DIAGNOSIS — E1165 Type 2 diabetes mellitus with hyperglycemia: Secondary | ICD-10-CM | POA: Diagnosis not present

## 2015-08-02 DIAGNOSIS — E039 Hypothyroidism, unspecified: Secondary | ICD-10-CM | POA: Insufficient documentation

## 2015-08-02 DIAGNOSIS — Z23 Encounter for immunization: Secondary | ICD-10-CM

## 2015-08-02 DIAGNOSIS — IMO0001 Reserved for inherently not codable concepts without codable children: Secondary | ICD-10-CM

## 2015-08-02 LAB — LIPID PANEL
CHOL/HDL RATIO: 4
Cholesterol: 103 mg/dL (ref 0–200)
HDL: 23.3 mg/dL — ABNORMAL LOW (ref >39.00)
LDL Cholesterol: 44 mg/dL (ref 0–99)
NONHDL: 79.8
Triglycerides: 179 mg/dL — ABNORMAL HIGH (ref 0.0–149.0)
VLDL: 35.8 mg/dL (ref 0.0–40.0)

## 2015-08-02 LAB — COMPREHENSIVE METABOLIC PANEL
ALT: 35 U/L (ref 0–53)
AST: 24 U/L (ref 0–37)
Albumin: 4.3 g/dL (ref 3.5–5.2)
Alkaline Phosphatase: 86 U/L (ref 39–117)
BILIRUBIN TOTAL: 0.7 mg/dL (ref 0.2–1.2)
BUN: 10 mg/dL (ref 6–23)
CO2: 28 mEq/L (ref 19–32)
CREATININE: 0.82 mg/dL (ref 0.40–1.50)
Calcium: 9.9 mg/dL (ref 8.4–10.5)
Chloride: 102 mEq/L (ref 96–112)
GFR: 108.19 mL/min (ref >60.00)
GLUCOSE: 167 mg/dL — AB (ref 70–99)
Potassium: 4.5 mEq/L (ref 3.5–5.1)
Sodium: 139 mEq/L (ref 135–145)
Total Protein: 7.5 g/dL (ref 6.0–8.3)

## 2015-08-02 LAB — TSH: TSH: 2.2 u[IU]/mL (ref 0.35–4.50)

## 2015-08-02 LAB — HEMOGLOBIN A1C: Hgb A1c MFr Bld: 7.5 % — ABNORMAL HIGH (ref 4.6–6.5)

## 2015-08-02 MED ORDER — INSULIN GLARGINE 100 UNIT/ML SOLOSTAR PEN: 30.0000 [IU] | PEN_INJECTOR | Freq: Every day | SUBCUTANEOUS | Status: DC

## 2015-08-02 NOTE — Progress Notes (Signed)
Patient ID: Raymond Fitzgerald, male   DOB: [birth year], 44 y.o.   MRN: [id number]  HPI: Raymond Fitzgerald is a 44 y.o.-year-old male, initially referred by his PCP, Dr. Cleta Alberts, for management of DM2, dx 2011, insulin-dependent since 2014, uncontrolled, without complications. He moved from [place]. [place]. New PCP: Dr. Pershing Proud. Last visit 4.5 mo ago.  DM2: Last hemoglobin A1c was: Lab Results  Component Value Date   HGBA1C 8.6* 03/14/2015   HGBA1C 9.6 08/24/2014  10/2014: HbA1c 9.5% 05/2014: HbA1c 8.5%  Pt was on a regimen of: - Metformin 850 >> 1000 mg po bid >> tolerates it well - Amaryl 2 mg 2x a day - Januvia 50 >> 100 mg daily (for 2-3 years) >> Tradjenta 5 mg in am - Lantus 25 >> 30 units qhs. Did not switch to Toujeo  He is now on: - Metformin 1000 mg 2x a day with meal - Lantus 30 units at bedtime - Glipizide 5 mg 2x a day before meals (increase to 10 mg if you have a large meal) - Bydureon 2 mg weekly >> nodules under skin at inj sites  Pt checks his sugars 0-2x a day - they are better, but per log, they are better: - am: n/c >> 143-195 >> 130-190 >> 107-175 (can forget Lantus at night) - 2h after b'fast: n/c >> 278 >> 203 >> 203 - before lunch: n/c >> 183, 184 >> 236 >> 89-167 - 2h after lunch: n/c >> 183 >> 201 >> 91, 150-196 - before dinner: n/c >> 126-216, 239 >> 157-231 >> 106-169 - 2h after dinner: n/c >> 200-211 >> 230-250 >> 147-191 - bedtime: n/c - nighttime: n/c No lows. Lowest sugar was 150 >> 143 >> 89; he has hypoglycemia awareness at 70.  Highest sugar was 297 >> 278x1>> 230-250 with carbs >> 200 (missed insulin)  Meter: GE (not covered by [facility]).  Pt's meals are: - Breakfast: tea (unsweet) + cookies  - Lunch: tortilla + vegetables, chicken/fish/seafood - Dinner: same - Snacks: 2 : protein bars mid-pm; post dinner: fruit, bar  - no CKD, last BUN/creatinine:  Lab Results  Component Value Date   BUN 11 08/24/2014   CREATININE 0.76 08/24/2014    - last set of lipids: Lab Results  Component Value Date   CHOL 157 08/24/2014   HDL 31* 08/24/2014   LDLCALC 72 08/24/2014   TRIG 269* 08/24/2014   CHOLHDL 5.1 08/24/2014  On Atorvastatin 20. - last eye exam was in 12/2014. No DR.  - no numbness and tingling in his feet.  He has hypothyroidism.  Last TSH: 4.54. He is on LT4 50 mcg: - in am - fasting - with water - 30 min before breakfast - takes Centrum at night  Lab Results  Component Value Date   TSH 3.628 08/24/2014   ROS: Constitutional: no weight gain/loss, no increased appetite, no fatigue, no subjective hyperthermia/hypothermia Eyes: no blurry vision, no xerophthalmia ENT: no sore throat, no nodules palpated in throat, no dysphagia/odynophagia, no hoarseness Cardiovascular: no CP/SOB/palpitations/leg swelling Respiratory: no cough/SOB Gastrointestinal: no N/V/D/C Musculoskeletal: no muscle/joint aches Skin: no rash, itching Neurological: no tremors/numbness/tingling/dizziness  I reviewed pt's medications, allergies, PMH, social hx, family hx, and changes were documented in the history of present illness. Otherwise, unchanged from my initial visit note:  Past Medical History  Diagnosis Date  . Diabetes mellitus without complication   . Thyroid disease   . Hyperlipidemia    No past surgical history on file. History  Social History  . Marital Status: Married    Spouse Name: N/A    Number of Children: 2   Occupational History  . Sales assistant   Social History Main Topics  . Smoking status: Never Smoker   . Smokeless tobacco: Not on file  . Alcohol Use: Yes  . Drug Use: No   Current Outpatient Prescriptions on File Prior to Visit  Medication Sig Dispense Refill  . atorvastatin (LIPITOR) 20 MG tablet Take 20 mg by mouth daily.    Marland Kitchen. glimepiride (AMARYL) 2 MG tablet Take 2 mg by mouth daily with breakfast.    . levothyroxine (SYNTHROID, LEVOTHROID) 50 MCG tablet Take 50 mcg by mouth daily before  breakfast.    . sitaGLIPtin (JANUVIA) 50 MG tablet Take 50 mg by mouth daily.     Metformin 850 mg 2x a day  Allergies  Allergen Reactions  . Penicillins Hives and Swelling   FH: - see HPI - HL in M, F - cancer in M  PE: BP 108/64 mmHg  Pulse 86  Temp(Src) 98.1 F (36.7 C) (Oral)  Resp 12  Wt 174 lb 12.8 oz (79.289 kg)  SpO2 97% Wt Readings from Last 3 Encounters:  08/02/15 174 lb 12.8 oz (79.289 kg)  03/14/15 179 lb (81.194 kg)  01/11/15 178 lb (80.74 kg)   Constitutional: overweight, in NAD Eyes: PERRLA, EOMI, no exophthalmos ENT: moist mucous membranes, no thyromegaly, no cervical lymphadenopathy Cardiovascular: RRR, No MRG Respiratory: CTA B Gastrointestinal: abdomen soft, NT, ND, BS+ Musculoskeletal: no deformities, strength intact in all 4 Skin: moist, warm Neurological: no tremor with outstretched hands, DTR normal in all 4  ASSESSMENT: 1. DM2, insulin-dependent, uncontrolled, without complications  2. Hypothyroidism  PLAN:  1. Patient with uncontrolled diabetes, on oral antidiabetic regimen + basal insulin, with much better control. We will continue current regimen, except, [facility] as he gets nodules under skin whenever he injects Bydureon. Patient Instructions  Please continue: - Metformin 1000 mg 2x a day with meal - Lantus 30 units at bedtime - Glipizide 5 mg 2x a day before meals - Bydureon 2 mg once a week (will try to change to Trulicity 1.5 mg weekly)  Please stop at the lab.  Please come back for a follow-up appointment in 3 months.  - continue checking sugars at different times of the day - check 2 times a day, rotating checks - advised for yearly eye exams >> he is UTD - will check: HbA1c, Lipids, CMP today - given more sugar log sheets - Return to clinic in 3 mo with sugar log   2. Hypothyroidism - taking LT4 50 mcg correctly - reviewed last TSH - normal - recheck TSH today  Office Visit on  08/02/2015  Component Date Value Ref Range Status  . TSH 08/02/2015 2.20  0.35 - 4.50 uIU/mL Final  . Cholesterol 08/02/2015 103  0 - 200 mg/dL Final   ATP III Classification       Desirable:  < 200 mg/dL               Borderline High:  200 - 239 mg/dL          High:  > = 213240 mg/dL  . Triglycerides 08/02/2015 179.0* 0.0 - 149.0 mg/dL Final   Normal:  <086<150 mg/dLBorderline High:  150 - 199 mg/dL  . HDL 08/02/2015 23.30* >39.00 mg/dL Final  . VLDL 57/84/696210/26/2016 35.8  0.0 - 40.0 mg/dL Final  .  LDL Cholesterol 08/02/2015 44  0 - 99 mg/dL Final  . Total CHOL/HDL Ratio 08/02/2015 4   Final                  Men          Women1/2 Average Risk     3.4          3.3Average Risk          5.0          4.42X Average Risk          9.6          7.13X Average Risk          15.0          11.0                      . NonHDL 08/02/2015 79.80   Final   NOTE:  Non-HDL goal should be 30 mg/dL higher than patient's LDL goal (i.e. LDL goal of < 70 mg/dL, would have non-HDL goal of < 100 mg/dL)  . Sodium 08/02/2015 139  135 - 145 mEq/L Final  . Potassium 08/02/2015 4.5  3.5 - 5.1 mEq/L Final  . Chloride 08/02/2015 102  96 - 112 mEq/L Final  . CO2 08/02/2015 28  19 - 32 mEq/L Final  . Glucose, Bld 08/02/2015 167* 70 - 99 mg/dL Final  . BUN 16/07/9603 10  6 - 23 mg/dL Final  . Creatinine, Ser 08/02/2015 0.82  0.40 - 1.50 mg/dL Final  . Total Bilirubin 08/02/2015 0.7  0.2 - 1.2 mg/dL Final  . Alkaline Phosphatase 08/02/2015 86  39 - 117 U/L Final  . AST 08/02/2015 24  0 - 37 U/L Final  . ALT 08/02/2015 35  0 - 53 U/L Final  . Total Protein 08/02/2015 7.5  6.0 - 8.3 g/dL Final  . Albumin 54/06/8118 4.3  3.5 - 5.2 g/dL Final  . Calcium 14/78/2956 9.9  8.4 - 10.5 mg/dL Final  . GFR 21/30/8657 108.19  >60.00 mL/min Final  . Hgb A1c MFr Bld 08/02/2015 7.5* 4.6 - 6.5 % Final   Glycemic Control Guidelines for People with Diabetes:Non Diabetic:  <6%Goal of Therapy: <7%Additional Action Suggested:  >8%    HbA1c better;  cholesterol levels also better except HDL, which is a little lower. TFTs are normal.

## 2015-08-02 NOTE — Patient Instructions (Signed)
Please continue: - Metformin 1000 mg 2x a day with meal - Lantus 30 units at bedtime - Glipizide 5 mg 2x a day before meals - Bydureon 2 mg once a week  Please stop at the lab.  Please come back for a follow-up appointment in 3 months.

## 2015-08-03 ENCOUNTER — Encounter: Payer: Self-pay | Admitting: Internal Medicine

## 2015-08-03 ENCOUNTER — Other Ambulatory Visit: Payer: Self-pay | Admitting: *Deleted

## 2015-08-03 MED ORDER — DULAGLUTIDE 1.5 MG/0.5ML ~~LOC~~ SOAJ: SUBCUTANEOUS | Status: DC

## 2015-08-03 NOTE — Telephone Encounter (Signed)
Sending rx refill to pharmacy to initiate a PA for pt.

## 2015-08-08 ENCOUNTER — Other Ambulatory Visit: Payer: Self-pay | Admitting: Internal Medicine

## 2015-08-08 ENCOUNTER — Telehealth: Payer: Self-pay | Admitting: Internal Medicine

## 2015-08-08 NOTE — Telephone Encounter (Signed)
Pt is checking on the PA for for the trulicity

## 2015-08-09 NOTE — Telephone Encounter (Signed)
Called pt and lvm advising him that we faxed the Prior Auth for the Trulicity to his ins co, twice.

## 2015-08-15 ENCOUNTER — Other Ambulatory Visit: Payer: Self-pay | Admitting: Internal Medicine

## 2015-08-16 ENCOUNTER — Other Ambulatory Visit: Payer: Self-pay | Admitting: *Deleted

## 2015-08-16 MED ORDER — INSULIN GLARGINE 100 UNIT/ML SOLOSTAR PEN: 30.0000 [IU] | PEN_INJECTOR | Freq: Every day | SUBCUTANEOUS | Status: DC

## 2015-08-16 MED ORDER — METFORMIN HCL 1000 MG PO TABS: 1000.0000 mg | ORAL_TABLET | Freq: Two times a day (BID) | ORAL | Status: DC

## 2015-08-16 MED ORDER — LEVOTHYROXINE SODIUM 50 MCG PO TABS: 50.0000 ug | ORAL_TABLET | Freq: Every day | ORAL | Status: DC

## 2015-08-16 MED ORDER — INSULIN PEN NEEDLE 32G X 4 MM MISC: Status: DC

## 2015-08-16 MED ORDER — ATORVASTATIN CALCIUM 20 MG PO TABS: 20.0000 mg | ORAL_TABLET | Freq: Every day | ORAL | Status: DC

## 2015-08-16 MED ORDER — GLIPIZIDE 5 MG PO TABS: 5.0000 mg | ORAL_TABLET | Freq: Two times a day (BID) | ORAL | Status: DC

## 2015-08-16 MED ORDER — GLUCOSE BLOOD VI STRP: ORAL_STRIP | Status: DC

## 2015-08-16 NOTE — Telephone Encounter (Signed)
Pt has requested rx refills for 90 day supply, [place]

## 2015-08-22 ENCOUNTER — Other Ambulatory Visit: Payer: Self-pay | Admitting: *Deleted

## 2015-08-22 MED ORDER — ALBIGLUTIDE 30 MG ~~LOC~~ PEN: 30.0000 mg | PEN_INJECTOR | SUBCUTANEOUS | Status: DC

## 2015-08-22 NOTE — Telephone Encounter (Signed)
Ins would not approve Trulicity.

## 2015-10-03 ENCOUNTER — Other Ambulatory Visit: Payer: Self-pay | Admitting: Internal Medicine

## 2015-11-01 ENCOUNTER — Ambulatory Visit: Payer: Self-pay | Admitting: Internal Medicine

## 2015-11-01 ENCOUNTER — Telehealth: Payer: Self-pay | Admitting: Internal Medicine

## 2015-11-01 MED ORDER — INSULIN DETEMIR 100 UNIT/ML FLEXPEN: 30.0000 [IU] | PEN_INJECTOR | Freq: Every day | SUBCUTANEOUS | Status: DC

## 2015-11-01 NOTE — Telephone Encounter (Signed)
Yes

## 2015-11-01 NOTE — Telephone Encounter (Signed)
Please read message below. Switch to Levemir? Please advise.

## 2015-11-01 NOTE — Telephone Encounter (Signed)
Patient stated his insurance has changed to [facility] they do not approve of lantus, please advise

## 2015-11-01 NOTE — Telephone Encounter (Signed)
Switched to Levemir per Dr Elvera Lennox.

## 2015-11-02 ENCOUNTER — Telehealth: Payer: Self-pay | Admitting: Internal Medicine

## 2015-11-02 MED ORDER — DULAGLUTIDE 1.5 MG/0.5ML ~~LOC~~ SOAJ: 1.5000 mg | SUBCUTANEOUS | Status: DC

## 2015-11-02 NOTE — Telephone Encounter (Signed)
Please read message below and advise.  

## 2015-11-02 NOTE — Telephone Encounter (Signed)
I read that wrong. I will call him and schedule.

## 2015-11-02 NOTE — Telephone Encounter (Addendum)
Patient stated that [employer] will not cover the [place]um either, please advise

## 2015-11-02 NOTE — Telephone Encounter (Signed)
We need an appt to discuss alternatives. He is due - had one but had to cancel - can put him on waiting list. Please do not add him today pm or in any half a day with 10 pts.

## 2015-11-02 NOTE — Telephone Encounter (Signed)
I do not see him on the list tomorrow morning. That is another patient with the same first name.

## 2015-11-02 NOTE — Telephone Encounter (Signed)
Pt is scheduled for tomorrow morning. Be advised.

## 2015-11-03 ENCOUNTER — Other Ambulatory Visit: Payer: Self-pay | Admitting: *Deleted

## 2015-11-03 ENCOUNTER — Telehealth: Payer: Self-pay | Admitting: Internal Medicine

## 2015-11-03 ENCOUNTER — Other Ambulatory Visit: Payer: Self-pay | Admitting: Internal Medicine

## 2015-11-03 MED ORDER — LEVOTHYROXINE SODIUM 50 MCG PO TABS: 50.0000 ug | ORAL_TABLET | Freq: Every day | ORAL | Status: DC

## 2015-11-03 MED ORDER — INSULIN DETEMIR 100 UNIT/ML FLEXPEN: 30.0000 [IU] | PEN_INJECTOR | Freq: Every day | SUBCUTANEOUS | Status: DC

## 2015-11-03 MED ORDER — METFORMIN HCL 1000 MG PO TABS: 1000.0000 mg | ORAL_TABLET | Freq: Two times a day (BID) | ORAL | Status: DC

## 2015-11-03 MED ORDER — ATORVASTATIN CALCIUM 20 MG PO TABS: 20.0000 mg | ORAL_TABLET | Freq: Every day | ORAL | Status: DC

## 2015-11-03 NOTE — Telephone Encounter (Signed)
error 

## 2015-11-03 NOTE — Telephone Encounter (Signed)
Patient need his medication to go, [facility] [address] [phone] please call patient.

## 2015-11-03 NOTE — Telephone Encounter (Signed)
Returned pt's call and advised him per Dr Gherghe's message. Pt voiced understanding.  

## 2015-11-03 NOTE — Telephone Encounter (Signed)
Refills sent to pts pharmacy.

## 2015-11-03 NOTE — Telephone Encounter (Signed)
Patient called stating that he would like all of his medications sent to his new pharmacy  Also, did you receive a response for the PA on Tan[place]   Rx:  All medications   Pharmacy: [facility]   Thank you

## 2015-11-06 ENCOUNTER — Telehealth: Payer: Self-pay | Admitting: Internal Medicine

## 2015-11-06 NOTE — Telephone Encounter (Signed)
Scheduled pt to come in Friday, Feb 3rd.

## 2015-11-06 NOTE — Telephone Encounter (Signed)
Returned pt's call. Discussed Tanzeum. Advised pt that Dr Elvera Lennox wants to see him before she does a PA for Tanzeum. Scheduled pt a follow up appt.

## 2015-11-06 NOTE — Telephone Encounter (Signed)
Pt called requesting to speak to [facility].

## 2015-11-07 ENCOUNTER — Telehealth: Payer: Self-pay | Admitting: Internal Medicine

## 2015-11-07 MED ORDER — GLIPIZIDE 5 MG PO TABS: 5.0000 mg | ORAL_TABLET | Freq: Two times a day (BID) | ORAL | Status: DC

## 2015-11-07 NOTE — Telephone Encounter (Signed)
Resent to pts pharmacy.

## 2015-11-07 NOTE — Telephone Encounter (Signed)
Patient ask if you could resend the glipizide the pharmacy didn't receive it. Please advise

## 2015-11-10 ENCOUNTER — Encounter: Payer: Self-pay | Admitting: Internal Medicine

## 2015-11-10 ENCOUNTER — Ambulatory Visit (INDEPENDENT_AMBULATORY_CARE_PROVIDER_SITE_OTHER): Payer: BLUE CROSS/BLUE SHIELD | Admitting: Internal Medicine

## 2015-11-10 VITALS — BP 110/74 | HR 86 | Temp 98.5°F | Ht 68.25 in | Wt 174.2 lb

## 2015-11-10 DIAGNOSIS — E1165 Type 2 diabetes mellitus with hyperglycemia: Secondary | ICD-10-CM | POA: Diagnosis not present

## 2015-11-10 DIAGNOSIS — IMO0001 Reserved for inherently not codable concepts without codable children: Secondary | ICD-10-CM

## 2015-11-10 DIAGNOSIS — E039 Hypothyroidism, unspecified: Secondary | ICD-10-CM

## 2015-11-10 DIAGNOSIS — Z794 Long term (current) use of insulin: Secondary | ICD-10-CM

## 2015-11-10 LAB — POCT GLYCOSYLATED HEMOGLOBIN (HGB A1C): Hemoglobin A1C: 7.6

## 2015-11-10 MED ORDER — DULAGLUTIDE 1.5 MG/0.5ML ~~LOC~~ SOAJ: 1.5000 mg | SUBCUTANEOUS | Status: DC

## 2015-11-10 MED ORDER — DAPAGLIFLOZIN PROPANEDIOL 10 MG PO TABS: 10.0000 mg | ORAL_TABLET | Freq: Every day | ORAL | Status: DC

## 2015-11-10 NOTE — Progress Notes (Signed)
Patient ID: Raymond Fitzgerald, male   DOB: June 18, 1971, 45 y.o.   MRN: [id number]  HPI: Raymond Fitzgerald is a 45 y.o.-year-old male, initially referred by his PCP, Dr. Cleta Alberts, for management of DM2, dx 2011, insulin-dependent since 2014, uncontrolled, without complications. He moved from [place]. [place] in 07/2014. New PCP: Dr. Pershing Proud. Last visit 4 mo ago.  DM2: Last hemoglobin A1c was: Lab Results  Component Value Date   HGBA1C 7.5* 08/02/2015   HGBA1C 8.6* 03/14/2015   HGBA1C 9.6 08/24/2014  10/2014: HbA1c 9.5% 05/2014: HbA1c 8.5%  Pt was on a regimen of: - Metformin 850 >> 1000 mg po bid >> tolerates it well - Amaryl 2 mg 2x a day - Januvia 50 >> 100 mg daily (for 2-3 years) >> Tradjenta 5 mg in am - Lantus 25 >> 30 units qhs. Did not switch to Toujeo  He is now on: - Metformin 1000 mg 2x a day with meal - Glipizide 5 mg 2x a day before meals (increase to 10 mg if you have a large meal) - Lantus 30 units at bedtime >> switching to Levemir - Bydureon 2 mg weekly >> nodules under skin at inj sites >> now Tanzeum (stopped 2 weeks ago - ran out, not covered)  Pt checks his sugars 0-2x a day (no log, no meter):  - am: n/c >> 143-195 >> 130-190 >> 107-175 >> 120-160 - 2h after b'fast: n/c >> 278 >> 203 >> 203 >> 140-180 - before lunch: n/c >> 183, 184 >> 236 >> 89-167 >> 100-140 - 2h after lunch: n/c >> 183 >> 201 >> 91, 150-196 >> 140-180 - before dinner: n/c >> 126-216, 239 >> 157-231 >> 106-169 >> 100-140 - 2h after dinner: n/c >> 200-211 >> 230-250 >> 147-191 >> 120-180 - bedtime: n/c  - nighttime: n/c No lows. Lowest sugar was 150 >> 143 >> 89 >> 80s; he has hypoglycemia awareness at 70.  Highest sugar was 297 >> 278x1>> 230-250 with carbs >> 200 (missed insulin) >> 190  Meter: GE (not covered by [facility]).  Pt's meals are: - Breakfast: tea (unsweet) + cookies  - Lunch: tortilla + vegetables, chicken/fish/seafood - Dinner: same - Snacks: 2 : protein bars mid-pm; post dinner:  fruit, bar  - no CKD, last BUN/creatinine:  Lab Results  Component Value Date   BUN 10 08/02/2015   CREATININE 0.82 08/02/2015   - last set of lipids: Lab Results  Component Value Date   CHOL 103 08/02/2015   HDL 23.30* 08/02/2015   LDLCALC 44 08/02/2015   TRIG 179.0* 08/02/2015   CHOLHDL 4 08/02/2015  On Atorvastatin 20. - last eye exam was in 12/2014. No DR.  - no numbness and tingling in his feet.  He has hypothyroidism.  Last TSH: 4.54. He is on LT4 50 mcg: - in am - fasting - with water - 30 min before breakfast - takes Centrum at night  Lab Results  Component Value Date   TSH 2.20 08/02/2015   ROS: Constitutional: no weight gain/loss, no increased appetite, no fatigue, no subjective hyperthermia/hypothermia Eyes: no blurry vision, no xerophthalmia ENT: no sore throat, no nodules palpated in throat, no dysphagia/odynophagia, no hoarseness Cardiovascular: no CP/SOB/palpitations/leg swelling Respiratory: no cough/SOB Gastrointestinal: no N/V/D/C Musculoskeletal: no muscle/+ joint aches Skin: no rash, itching Neurological: no tremors/numbness/tingling/dizziness  I reviewed pt's medications, allergies, PMH, social hx, family hx, and changes were documented in the history of present illness. Otherwise, unchanged from my initial visit note, except started Naproxen for  shoulder pain:  Past Medical History  Diagnosis Date  . Diabetes mellitus without complication (HCC)   . Thyroid disease   . Hyperlipidemia    No past surgical history on file. History   Social History  . Marital Status: Married    Spouse Name: N/A    Number of Children: 2   Occupational History  . Sales assistant   Social History Main Topics  . Smoking status: Never Smoker   . Smokeless tobacco: Not on file  . Alcohol Use: Yes  . Drug Use: No   Current Outpatient Prescriptions on File Prior to Visit  Medication Sig Dispense Refill  . atorvastatin (LIPITOR) 20 MG tablet Take 20 mg by  mouth daily.    Marland Kitchen glimepiride (AMARYL) 2 MG tablet Take 2 mg by mouth daily with breakfast.    . levothyroxine (SYNTHROID, LEVOTHROID) 50 MCG tablet Take 50 mcg by mouth daily before breakfast.    . sitaGLIPtin (JANUVIA) 50 MG tablet Take 50 mg by mouth daily.     Metformin 850 mg 2x a day  Allergies  Allergen Reactions  . Penicillins Hives and Swelling   FH: - see HPI - HL in M, F - cancer in M  PE: BP 110/74 mmHg  Pulse 86  Temp(Src) 98.5 F (36.9 C) (Oral)  Ht 5' 8.25" (1.734 m)  Wt 174 lb 4 oz (79.039 kg)  BMI 26.29 kg/m2  SpO2 98% Wt Readings from Last 3 Encounters:  11/10/15 174 lb 4 oz (79.039 kg)  08/02/15 174 lb 12.8 oz (79.289 kg)  03/14/15 179 lb (81.194 kg)   Constitutional: overweight, in NAD Eyes: PERRLA, EOMI, no exophthalmos ENT: moist mucous membranes, no thyromegaly, no cervical lymphadenopathy Cardiovascular: RRR, No MRG Respiratory: CTA B Gastrointestinal: abdomen soft, NT, ND, BS+ Musculoskeletal: no deformities, strength intact in all 4 Skin: moist, warm Neurological: no tremor with outstretched hands, DTR normal in all 4  ASSESSMENT: 1. DM2, insulin-dependent, uncontrolled, without complications  2. Hypothyroidism  PLAN:  1. Patient with uncontrolled diabetes, on oral antidiabetic regimen + basal insulin, fair control, but sugars still above target. We need to switch from Bydureon to Trulicity as (nodules under skin whenever he injects Bydureon) - he was on Tanzeum but this is not covered by insurance and will also add [place]. - advised him to stay hydrated as we start Farxiga + given discount card  Patient Instructions  Please continue: - Metformin 1000 mg 2x a day with meal - Lantus/Levemir 30 units at bedtime - Glipizide 5 mg 2x a day before meals - Trulicity 1.5 mg once a week  Please add Farxiga 10 mg in am  Please come back for a follow-up appointment in 3 months.  - continue checking sugars at different times of the day -  check 2 times a day, rotating checks - advised for yearly eye exams >> he is UTD - will check HbA1c today >> 7.6% (stable) - given more sugar log sheets - Return to clinic in 3 mo with sugar log   2. Hypothyroidism - taking LT4 50 mcg correctly >> continue this dose - reviewed last TSH - normal 4 mo ago

## 2015-11-10 NOTE — Progress Notes (Signed)
Pre visit review using our clinic review tool, if applicable. No additional management support is needed unless otherwise documented below in the visit note. 

## 2015-11-10 NOTE — Patient Instructions (Signed)
Patient Instructions  Please continue: - Metformin 1000 mg 2x a day with meal - Lantus 30 units at bedtime - Glipizide 5 mg 2x a day before meals - Trulicity 1.5 mg once a week  Please add Farxiga 10 mg in am  Please come back for a follow-up appointment in 3 months.

## 2015-11-13 ENCOUNTER — Other Ambulatory Visit: Payer: Self-pay | Admitting: *Deleted

## 2015-11-13 MED ORDER — CANAGLIFLOZIN 100 MG PO TABS: 100.0000 mg | ORAL_TABLET | Freq: Every day | ORAL | Status: DC

## 2015-11-13 MED ORDER — EXENATIDE ER 2 MG ~~LOC~~ PEN: 2.0000 mg | PEN_INJECTOR | SUBCUTANEOUS | Status: DC

## 2015-11-16 ENCOUNTER — Other Ambulatory Visit: Payer: Self-pay | Admitting: *Deleted

## 2015-11-16 MED ORDER — DULAGLUTIDE 1.5 MG/0.5ML ~~LOC~~ SOAJ: 1.5000 mg | SUBCUTANEOUS | Status: DC

## 2015-11-16 NOTE — Telephone Encounter (Signed)
Sent rx to see if ins will cover. Spoke with patient he said to try and then we can send a PA if not. Be advised.

## 2015-11-27 ENCOUNTER — Telehealth: Payer: Self-pay | Admitting: Internal Medicine

## 2015-11-27 ENCOUNTER — Encounter: Payer: Self-pay | Admitting: Internal Medicine

## 2015-11-27 NOTE — Telephone Encounter (Signed)
I would suggest to try Victoza but we may try to increase the insulin, if no lows.

## 2015-11-27 NOTE — Telephone Encounter (Signed)
Called pt and advised him that the ins would not approve either one. The alternative that must be tried is Victoza. Pt stated that he wants to wait for now. He wants to know, since he was not approved for the medications, does Dr Elvera Lennox want him to go ahead and increase the Levemir to 35 units. Please advise.

## 2015-11-27 NOTE — Telephone Encounter (Signed)
Pt has questions regarding the insurance and the trulicity and tanzeum rx

## 2015-12-15 ENCOUNTER — Ambulatory Visit: Payer: Self-pay | Admitting: Internal Medicine

## 2016-01-29 ENCOUNTER — Other Ambulatory Visit: Payer: Self-pay | Admitting: Internal Medicine

## 2016-01-30 ENCOUNTER — Other Ambulatory Visit: Payer: Self-pay | Admitting: Internal Medicine

## 2016-01-30 NOTE — Telephone Encounter (Signed)
Refills did not go through earlier.

## 2016-02-07 ENCOUNTER — Ambulatory Visit: Payer: Self-pay | Admitting: Internal Medicine

## 2016-02-26 ENCOUNTER — Other Ambulatory Visit: Payer: Self-pay | Admitting: Internal Medicine

## 2016-03-25 ENCOUNTER — Other Ambulatory Visit: Payer: Self-pay | Admitting: Internal Medicine

## 2016-04-11 ENCOUNTER — Encounter: Payer: Self-pay | Admitting: Internal Medicine

## 2016-04-11 ENCOUNTER — Ambulatory Visit (INDEPENDENT_AMBULATORY_CARE_PROVIDER_SITE_OTHER): Payer: BLUE CROSS/BLUE SHIELD | Admitting: Internal Medicine

## 2016-04-11 VITALS — BP 128/80 | HR 93 | Ht 68.0 in | Wt 171.0 lb

## 2016-04-11 DIAGNOSIS — IMO0001 Reserved for inherently not codable concepts without codable children: Secondary | ICD-10-CM

## 2016-04-11 DIAGNOSIS — Z794 Long term (current) use of insulin: Secondary | ICD-10-CM | POA: Diagnosis not present

## 2016-04-11 DIAGNOSIS — E1165 Type 2 diabetes mellitus with hyperglycemia: Secondary | ICD-10-CM | POA: Diagnosis not present

## 2016-04-11 DIAGNOSIS — E039 Hypothyroidism, unspecified: Secondary | ICD-10-CM | POA: Diagnosis not present

## 2016-04-11 LAB — TSH: TSH: 3.2 u[IU]/mL (ref 0.35–4.50)

## 2016-04-11 LAB — BASIC METABOLIC PANEL
BUN: 13 mg/dL (ref 6–23)
CALCIUM: 10 mg/dL (ref 8.4–10.5)
CO2: 26 meq/L (ref 19–32)
Chloride: 101 mEq/L (ref 96–112)
Creatinine, Ser: 0.87 mg/dL (ref 0.40–1.50)
GFR: 100.74 mL/min (ref >60.00)
Glucose, Bld: 162 mg/dL — ABNORMAL HIGH (ref 70–99)
Potassium: 4.4 mEq/L (ref 3.5–5.1)
SODIUM: 137 meq/L (ref 135–145)

## 2016-04-11 LAB — POCT GLYCOSYLATED HEMOGLOBIN (HGB A1C): HEMOGLOBIN A1C: 8.1

## 2016-04-11 LAB — T4, FREE: FREE T4: 0.94 ng/dL (ref 0.60–1.60)

## 2016-04-11 NOTE — Patient Instructions (Addendum)
Patient Instructions  Please continue: - Metformin 1000 mg 2x a day with meal - Invokana 100 mg daily in am - Lantus 30 units at bedtime  Please continue: - Glipizide 5 mg 2x a day but move before lunch and dinner.  Try to stop the frappe.  Please call and schedule an eye appt with Dr. Randon GoldsmithLyles: [facility]:  Dr. Jeni SallesGraham W. Lyles MD ?  Address: [address], [place], [place] [address]  [phone]  Please come back for a follow-up appointment in 3 months.

## 2016-04-11 NOTE — Progress Notes (Signed)
Patient ID: Raymond Fitzgerald, male   DOB: 1970/11/16, 45 y.o.   MRN: [id number]  HPI: Raymond Buffyrakash Overholt is a 45 y.o.-year-old male, initially referred by his PCP, Dr. Cleta Albertsaub, for management of DM2, dx 2011, insulin-dependent since 2014, uncontrolled, without complications. [place]. New PCP: Dr. Pershing ProudWilliam Dwight. Last visit 5 mo ago.  He was in vacation for 2 weeks >> sugars higher. He also drinks frappe every day.   DM2: Last hemoglobin A1c was: Lab Results  Component Value Date   HGBA1C 7.6 11/10/2015   HGBA1C 7.5* 08/02/2015   HGBA1C 8.6* 03/14/2015  10/2014: HbA1c 9.5% 05/2014: HbA1c 8.5%  Pt was on a regimen of: - Metformin 850 >> 1000 mg po bid >> tolerates it well - Amaryl 2 mg 2x a day - Januvia 50 >> 100 mg daily (for 2-3 years) >> Tradjenta 5 mg in am - Lantus 25 >> 30 units qhs. Did not switch to Toujeo  He was then on: - Metformin 1000 mg 2x a day with meal - Glipizide 5 mg 2x a day before meals (increase to 10 mg if you have a large meal) - Levemir 30 units at bedtime - Invokana 100 mg in am - Bydureon 2 mg weekly >> nodules under skin at inj sites >> Tanzeum (stopped 2 weeks ago - ran out, not covered) >> Victoza 1.8 mg daily.  Pt checks his sugars 0-2x a day (no log, no meter):  - am: n/c >> 143-195 >> 130-190 >> 107-175 >> 120-160 >> 120-145 - 2h after b'fast: n/c >> 278 >> 203 >> 203 >> 140-180 >> 130-160 - before lunch: n/c >> 183, 184 >> 236 >> 89-167 >> 100-140 >> 130-145 - 2h after lunch: n/c >> 183 >> 201 >> 91, 150-196 >> 140-180 >> 180-220 (if dessert >> 245) - before dinner: n/c >> 126-216, 239 >> 157-231 >> 106-169 >> 100-140 >> 80s (when exercising in am), 130-140 - 2h after dinner: n/c >> 200-211 >> 230-250 >> 147-191 >> 120-180 >> 180s - bedtime: n/c  - nighttime: n/c No lows. Lowest sugar was 150 >> 143 >> 89 >> 80s >> 80s-90s 4-5x; he has hypoglycemia awareness at 70.  Highest sugar was 297 >> 278x1>> 230-250 with carbs >> 200  (missed insulin) >> 190 >> 245  Meter: GE (not [facility] by [facility]  Pt's meals are: - Breakfast: tea (unsweet) + cookies  - Lunch: tortilla + vegetables, chicken/fish/seafood - Dinner: same - Snacks: 2 : protein bars mid-pm; post dinner: fruit, bar  - no CKD, last BUN/creatinine:  Lab Results  Component Value Date   BUN 10 08/02/2015   CREATININE 0.82 08/02/2015   - last set of lipids: Lab Results  Component Value Date   CHOL 103 08/02/2015   HDL 23.30* 08/02/2015   LDLCALC 44 08/02/2015   TRIG 179.0* 08/02/2015   CHOLHDL 4 08/02/2015  On Atorvastatin 20. - last eye exam was in 12/2014. No DR.  - no numbness and tingling in his feet.  He has hypothyroidism.   He is on LT4 50 mcg: - in am - fasting - with water - 30 min before breakfast - takes Centrum at night  Lab Results  Component Value Date   TSH 2.20 08/02/2015   ROS: Constitutional: no weight gain/loss, no increased appetite, no fatigue, no subjective hyperthermia/hypothermia Eyes: no blurry vision, no xerophthalmia ENT: no sore throat, no nodules palpated in throat, no dysphagia/odynophagia, no hoarseness Cardiovascular: no CP/SOB/palpitations/leg swelling Respiratory: no  cough/SOB Gastrointestinal: no N/V/D/C Musculoskeletal: no muscle/+ joint aches Skin: no rash, itching Neurological: no tremors/numbness/tingling/dizziness  I reviewed pt's medications, allergies, PMH, social hx, family hx, and changes were documented in the history of present illness. Otherwise, unchanged from my initial visit note, except started Naproxen for shoulder pain:  Past Medical History  Diagnosis Date  . Diabetes mellitus without complication (HCC)   . Thyroid disease   . Hyperlipidemia    No past surgical history on file. History   Social History  . Marital Status: Married    Spouse Name: N/A    Number of Children: 2   Occupational History  . Sales assistant   Social History Main Topics  . Smoking status: Never  Smoker   . Smokeless tobacco: Not on file  . Alcohol Use: Yes  . Drug Use: No   Current Outpatient Prescriptions on File Prior to Visit  Medication Sig Dispense Refill  . atorvastatin (LIPITOR) 20 MG tablet Take 20 mg by mouth daily.    Marland Kitchen. glimepiride (AMARYL) 2 MG tablet Take 2 mg by mouth daily with breakfast.    . levothyroxine (SYNTHROID, LEVOTHROID) 50 MCG tablet Take 50 mcg by mouth daily before breakfast.    . sitaGLIPtin (JANUVIA) 50 MG tablet Take 50 mg by mouth daily.     Metformin 850 mg 2x a day  Allergies  Allergen Reactions  . Penicillins Hives and Swelling   FH: - see HPI - HL in M, F - cancer in M  PE: BP 128/80 mmHg  Pulse 93  Ht 5\' 8"  (1.727 m)  Wt 171 lb (77.565 kg)  BMI 26.01 kg/m2  SpO2 96%  Wt Readings from Last 3 Encounters:  04/11/16 171 lb (77.565 kg)  11/10/15 174 lb 4 oz (79.039 kg)  08/02/15 174 lb 12.8 oz (79.289 kg)   Constitutional: overweight, in NAD Eyes: PERRLA, EOMI, no exophthalmos ENT: moist mucous membranes, no thyromegaly, no cervical lymphadenopathy Cardiovascular: RRR, No MRG Respiratory: CTA B Gastrointestinal: abdomen soft, NT, ND, BS+ Musculoskeletal: no deformities, strength intact in all 4 Skin: moist, warm Neurological: no tremor with outstretched hands, DTR normal in all 4  ASSESSMENT: 1. DM2, insulin-dependent, uncontrolled, without complications  2. Hypothyroidism  PLAN:  1. Patient with uncontrolled diabetes, on oral antidiabetic regimen + basal insulin, with worse sugars c/w last visit, especially after lunch, /2 dietary indiscretions (eating more, every day coffee frappe's.  - GLP1 R agonists were not approved. We also started Invokana at last visit. Will check BMP today. - advised to stop the frappes and move Glipizide before lunch and dinner - advised him to: Patient Instructions  Please continue: - Metformin 1000 mg 2x a day with meal - Invokana 100 mg daily in am - Lantus 30 units at bedtime  Please  continue: - Glipizide 5 mg 2x a day but move before lunch and dinner.  Try to stop the frappe.  Please call and schedule an eye appt with Dr. Randon GoldsmithLyles: [facility]:  Dr. Jeni SallesGraham W. Lyles MD ?  Address: [address], [place], [place] [address]  [phone]  Please come back for a follow-up appointment in 3 months.  - continue checking sugars at different times of the day - check 2 times a day, rotating checks - advised for yearly eye exams >> he needs a new one - will check HbA1c today >> 8.1% (higher) - Return to clinic in 3 mo with sugar log   2. Hypothyroidism - taking LT4 50  mcg correctly >> continue this dose. He takes it correctly. - reviewed last TSH - normal >> recheck today  Office Visit on 04/11/2016  Component Date Value Ref Range Status  . Sodium 04/11/2016 137  135 - 145 mEq/L Final  . Potassium 04/11/2016 4.4  3.5 - 5.1 mEq/L Final  . Chloride 04/11/2016 101  96 - 112 mEq/L Final  . CO2 04/11/2016 26  19 - 32 mEq/L Final  . Glucose, Bld 04/11/2016 162* 70 - 99 mg/dL Final  . BUN 16/07/9603 13  6 - 23 mg/dL Final  . Creatinine, Ser 04/11/2016 0.87  0.40 - 1.50 mg/dL Final  . Calcium 54/06/8118 10.0  8.4 - 10.5 mg/dL Final  . GFR 14/78/2956 100.74  >60.00 mL/min Final  . TSH 04/11/2016 3.20  0.35 - 4.50 uIU/mL Final  . Free T4 04/11/2016 0.94  0.60 - 1.60 ng/dL Final  . Hemoglobin O1H 04/11/2016 8.1   Final   Normal TFTs. Glu higher, but not fasting. Potassium and GFR normal.

## 2016-04-12 ENCOUNTER — Telehealth: Payer: Self-pay

## 2016-04-12 NOTE — Telephone Encounter (Signed)
Called and spoke with patient. Gave normal results, patient had no questions or concerns.

## 2016-04-22 ENCOUNTER — Other Ambulatory Visit: Payer: Self-pay | Admitting: Internal Medicine

## 2016-04-24 ENCOUNTER — Other Ambulatory Visit: Payer: Self-pay

## 2016-04-24 MED ORDER — INSULIN PEN NEEDLE 32G X 4 MM MISC: Status: DC

## 2016-04-24 NOTE — Telephone Encounter (Signed)
Faxed rx request for pen needles sent into patient pharmacy.

## 2016-05-16 ENCOUNTER — Other Ambulatory Visit: Payer: Self-pay | Admitting: Internal Medicine

## 2016-06-05 LAB — HM DIABETES EYE EXAM

## 2016-07-09 ENCOUNTER — Ambulatory Visit: Payer: Self-pay | Admitting: Internal Medicine

## 2016-07-16 ENCOUNTER — Other Ambulatory Visit: Payer: Self-pay | Admitting: Internal Medicine

## 2016-07-17 ENCOUNTER — Other Ambulatory Visit: Payer: Self-pay | Admitting: Internal Medicine

## 2016-08-22 ENCOUNTER — Ambulatory Visit (INDEPENDENT_AMBULATORY_CARE_PROVIDER_SITE_OTHER): Payer: BLUE CROSS/BLUE SHIELD | Admitting: Internal Medicine

## 2016-08-22 ENCOUNTER — Encounter: Payer: Self-pay | Admitting: Internal Medicine

## 2016-08-22 VITALS — BP 140/90 | HR 97 | Wt 168.0 lb

## 2016-08-22 DIAGNOSIS — E118 Type 2 diabetes mellitus with unspecified complications: Secondary | ICD-10-CM | POA: Diagnosis not present

## 2016-08-22 DIAGNOSIS — E039 Hypothyroidism, unspecified: Secondary | ICD-10-CM

## 2016-08-22 LAB — LIPID PANEL
Cholesterol: 143 mg/dL (ref 0–200)
HDL: 30.9 mg/dL — ABNORMAL LOW (ref >39.00)
NONHDL: 111.65
Total CHOL/HDL Ratio: 5
Triglycerides: 235 mg/dL — ABNORMAL HIGH (ref 0.0–149.0)
VLDL: 47 mg/dL — AB (ref 0.0–40.0)

## 2016-08-22 LAB — TSH: TSH: 2.27 u[IU]/mL (ref 0.35–4.50)

## 2016-08-22 LAB — POCT GLYCOSYLATED HEMOGLOBIN (HGB A1C): HEMOGLOBIN A1C: 8.1

## 2016-08-22 LAB — LDL CHOLESTEROL, DIRECT: LDL DIRECT: 95 mg/dL

## 2016-08-22 NOTE — Patient Instructions (Addendum)
Patient Instructions  Please continue: - Metformin 1000 mg 2x a day with meal - Invokana 100 mg daily in am - Glipizide 5 mg 2x a day but move before b'fast and dinner. - Levemir 30 units at bedtime  Continue cinnamon water.  Please continue Levothyroxine 50 mcg daily.  Take the thyroid hormone every day, with water, at least 30 minutes before breakfast, separated by at least 4 hours from: - acid reflux medications - calcium - iron - multivitamins  Please stop at the lab.  Please come back for a follow-up appointment in 3 months.

## 2016-08-22 NOTE — Progress Notes (Signed)
Patient ID: Raymond Fitzgerald, male   DOB: October 11, 1970, 45 y.o.   MRN: [id number]  HPI: Raymond Fitzgerald is a 45 y.o.-year-old male, initially referred by his PCP, Dr. Cleta Albertsaub, for management of DM2, dx 2011, insulin-dependent since 2014, uncontrolled, without complications. [place]. New PCP: Dr. Pershing ProudWilliam Dwight. Last visit 4 mo ago.  DM2: Last hemoglobin A1c was: Lab Results  Component Value Date   HGBA1C 8.1 08/22/2016   HGBA1C 8.1 04/11/2016   HGBA1C 7.6 11/10/2015  10/2014: HbA1c 9.5% 05/2014: HbA1c 8.5%  Pt was on a regimen of: - Metformin 850 >> 1000 mg po bid >> tolerates it well - Amaryl 2 mg 2x a day - Januvia 50 >> 100 mg daily (for 2-3 years) >> Tradjenta 5 mg in am - Lantus 25 >> 30 units qhs. Did not switch to Toujeo  He is on - Metformin 1000 mg 2x a day with meal - Glipizide 5 mg 2x a day before B and D (increase to 10 mg if you have a large meal) - Levemir 30 units at bedtime - Invokana 100 mg in am  - Bydureon 2 mg weekly >> nodules under skin at inj sites >> Tanzeum (stopped 2 weeks ago - ran out, not covered) >> Victoza 1.8 mg daily.  Started cinnamon water 10 days ago >> sugars are better.  Pt checks his sugars 0-2x a day (no log, no meter):  - am: n/c >> 143-195 >> 130-190 >> 107-175 >> 120-160 >> 120-145 >> 110-140 - 2h after b'fast: n/c >> 278 >> 203 >> 203 >> 140-180 >> 130-160 >> n/c - before lunch: n/c >> 183, 184 >> 236 >> 89-167 >> 100-140 >> 130-145 - 2h after lunch:  91, 150-196 >> 140-180 >> 180-220 (if dessert >> 245) >> 160-210 (178-183) - before dinner: 157-231 >> 106-169 >> 100-140 >> 80s (when exercising in am), 130-140 >> n/c - 2h after dinner: n/c >> 200-211 >> 230-250 >> 147-191 >> 120-180 >> 180s >> 150-180 - bedtime: n/c  - nighttime: n/c No lows. Lowest sugar was 80s-90s >> 80s after starting cinnamon water; he has hypoglycemia awareness at 70.  Highest sugar was 245 >> 300 if chocolate + frappe  Meter: GE (not  covered by [facility]).  Pt's meals are: - Breakfast: tea (unsweet) + cookies  - Lunch: tortilla + vegetables, chicken/fish/seafood - Dinner: same - Snacks: 2 : protein bars mid-pm; post dinner: fruit, bar  - no CKD, last BUN/creatinine:  Lab Results  Component Value Date   BUN 13 04/11/2016   CREATININE 0.87 04/11/2016   - last set of lipids: Lab Results  Component Value Date   CHOL 103 08/02/2015   HDL 23.30 (L) 08/02/2015   LDLCALC 44 08/02/2015   TRIG 179.0 (H) 08/02/2015   CHOLHDL 4 08/02/2015  On Atorvastatin 20. - last eye exam was in 06/05/2016. No DR.  - no numbness and tingling in his feet.  He has hypothyroidism.   He is on LT4 50 mcg: - in am - fasting - with water - 30 min before breakfast - takes Centrum at night  Lab Results  Component Value Date   TSH 3.20 04/11/2016   ROS: Constitutional: no weight gain/loss, no increased appetite, no fatigue, no subjective hyperthermia/hypothermia Eyes: no blurry vision, no xerophthalmia ENT: no sore throat, no nodules palpated in throat, no dysphagia/odynophagia, no hoarseness Cardiovascular: no CP/SOB/palpitations/leg swelling Respiratory: no cough/SOB Gastrointestinal: no N/V/D/C Musculoskeletal: no muscle/joint aches Skin: no rash, itching  Neurological: no tremors/numbness/tingling/dizziness  I reviewed pt's medications, allergies, PMH, social hx, family hx, and changes were documented in the history of present illness. Otherwise, unchanged from my initial visit note, except started Naproxen for shoulder pain:  Past Medical History:  Diagnosis Date  . Diabetes mellitus without complication (HCC)   . Hyperlipidemia   . Thyroid disease    No past surgical history on file. History   Social History  . Marital Status: Married    Spouse Name: N/A    Number of Children: 2   Occupational History  . Sales assistant   Social History Main Topics  . Smoking status: Never Smoker   . Smokeless tobacco: Not on  file  . Alcohol Use: Yes  . Drug Use: No   Current Outpatient Prescriptions on File Prior to Visit  Medication Sig Dispense Refill  . atorvastatin (LIPITOR) 20 MG tablet Take 20 mg by mouth daily.    Marland Kitchen. glimepiride (AMARYL) 2 MG tablet Take 2 mg by mouth daily with breakfast.    . levothyroxine (SYNTHROID, LEVOTHROID) 50 MCG tablet Take 50 mcg by mouth daily before breakfast.    . sitaGLIPtin (JANUVIA) 50 MG tablet Take 50 mg by mouth daily.     Metformin 850 mg 2x a day  Allergies  Allergen Reactions  . Penicillins Hives and Swelling   FH: - see HPI - HL in M, F - cancer in M  PE: BP 140/90   Pulse 97   Wt 168 lb (76.2 kg)   SpO2 98%   BMI 25.54 kg/m   Wt Readings from Last 3 Encounters:  08/22/16 168 lb (76.2 kg)  04/11/16 171 lb (77.6 kg)  11/10/15 174 lb 4 oz (79 kg)   Constitutional: overweight, in NAD Eyes: PERRLA, EOMI, no exophthalmos ENT: moist mucous membranes, no thyromegaly, no cervical lymphadenopathy Cardiovascular: RRR, No MRG Respiratory: CTA B Gastrointestinal: abdomen soft, NT, ND, BS+ Musculoskeletal: no deformities, strength intact in all 4 Skin: moist, warm Neurological: no tremor with outstretched hands, DTR normal in all 4  ASSESSMENT: 1. DM2, insulin-dependent, uncontrolled, without complications  2. Hypothyroidism  PLAN:  1. Patient with uncontrolled diabetes, on oral antidiabetic regimen + basal insulin, with similar sugars as at last visit, now improving after he started cinnamon water. He would like to keep his regimen the same and continue with the cinnamon water, but also restart exercise.  - GLP1 R agonists were not approved.  - advised him to: Patient Instructions  Please continue: - Metformin 1000 mg 2x a day with meal - Invokana 100 mg daily in am - Glipizide 5 mg 2x a day but move before b'fast and dinner. - Levemir 30 units at bedtime  Continue cinnamon water.  Please stop at the lab.  Please come back for a follow-up  appointment in 3 months.  - continue checking sugars at different times of the day - check 2 times a day, rotating checks - advised for yearly eye exams >> he is UTD - had a flu shot this season - will check Lipids today (nonfasting) - will check HbA1c today >> 8.1% (higher) - Return to clinic in 3 mo with sugar log   2. Hypothyroidism - taking LT4 50 mcg correctly >> continue this dose.  - reviewed last TSH - normal this summer >> recheck today  Office Visit on 08/22/2016  Component Date Value Ref Range Status  . Hemoglobin A1C 08/22/2016 8.1   Final  . Cholesterol 08/22/2016 143  0 -  200 mg/dL Final  . Triglycerides 08/22/2016 235.0* 0.0 - 149.0 mg/dL Final  . HDL 96/01/5408 30.90* >39.00 mg/dL Final  . VLDL 81/19/1478 47.0* 0.0 - 40.0 mg/dL Final  . Total CHOL/HDL Ratio 08/22/2016 5   Final  . NonHDL 08/22/2016 111.65   Final  . TSH 08/22/2016 2.27  0.35 - 4.50 uIU/mL Final  . Direct LDL 08/22/2016 95.0  mg/dL Final   TSH is normal. His triglycerides are higher than before, however, this was not a fasting sample. His LDL cholesterol is at goal.  Carlus Pavlov, MD PhD [facility] Endocrinology

## 2016-08-23 ENCOUNTER — Other Ambulatory Visit: Payer: Self-pay | Admitting: Internal Medicine

## 2016-08-25 ENCOUNTER — Other Ambulatory Visit: Payer: Self-pay | Admitting: Internal Medicine

## 2016-08-26 ENCOUNTER — Other Ambulatory Visit: Payer: Self-pay | Admitting: Internal Medicine

## 2016-09-15 ENCOUNTER — Other Ambulatory Visit: Payer: Self-pay | Admitting: Internal Medicine

## 2016-10-10 ENCOUNTER — Other Ambulatory Visit: Payer: Self-pay | Admitting: Internal Medicine

## 2016-10-14 ENCOUNTER — Telehealth: Payer: Self-pay

## 2016-10-14 ENCOUNTER — Telehealth: Payer: Self-pay | Admitting: Internal Medicine

## 2016-10-14 NOTE — Telephone Encounter (Signed)
Patient ask if he could get another coupon card for his medication Invokana. Please advise

## 2016-10-14 NOTE — Telephone Encounter (Signed)
Called and notified patient to call the number on the card to get pin. He advised he would try when he got home and would call tomorrow if it did not work.

## 2016-11-20 ENCOUNTER — Ambulatory Visit: Payer: Self-pay | Admitting: Internal Medicine

## 2016-11-20 ENCOUNTER — Telehealth: Payer: Self-pay | Admitting: Internal Medicine

## 2016-11-20 NOTE — Telephone Encounter (Signed)
Patient no showed today's appt. Please advise on how to follow up. °A. No follow up necessary. °B. Follow up urgent. Contact patient immediately. °C. Follow up necessary. Contact patient and schedule visit in ___ days. °D. Follow up advised. Contact patient and schedule visit in ____weeks. ° °

## 2016-11-20 NOTE — Telephone Encounter (Signed)
3mo

## 2016-11-20 NOTE — Telephone Encounter (Signed)
No show

## 2016-12-04 NOTE — Telephone Encounter (Signed)
Called patient left message to call back.pb

## 2016-12-06 ENCOUNTER — Other Ambulatory Visit: Payer: Self-pay | Admitting: Internal Medicine

## 2016-12-19 ENCOUNTER — Encounter: Payer: Self-pay | Admitting: Internal Medicine

## 2016-12-19 ENCOUNTER — Ambulatory Visit (INDEPENDENT_AMBULATORY_CARE_PROVIDER_SITE_OTHER): Payer: BLUE CROSS/BLUE SHIELD | Admitting: Internal Medicine

## 2016-12-19 ENCOUNTER — Other Ambulatory Visit: Payer: Self-pay

## 2016-12-19 VITALS — BP 118/72 | HR 77 | Ht 68.0 in | Wt 173.0 lb

## 2016-12-19 DIAGNOSIS — E039 Hypothyroidism, unspecified: Secondary | ICD-10-CM

## 2016-12-19 DIAGNOSIS — E1165 Type 2 diabetes mellitus with hyperglycemia: Secondary | ICD-10-CM

## 2016-12-19 DIAGNOSIS — IMO0001 Reserved for inherently not codable concepts without codable children: Secondary | ICD-10-CM

## 2016-12-19 DIAGNOSIS — Z794 Long term (current) use of insulin: Secondary | ICD-10-CM | POA: Diagnosis not present

## 2016-12-19 LAB — POCT GLYCOSYLATED HEMOGLOBIN (HGB A1C): Hemoglobin A1C: 7.7

## 2016-12-19 MED ORDER — DULAGLUTIDE 0.75 MG/0.5ML ~~LOC~~ SOAJ: SUBCUTANEOUS | 1 refills | Status: DC

## 2016-12-19 NOTE — Progress Notes (Signed)
Patient ID: Gorje Iyer, male   DOB: 09-Mar-1971, 46 y.o.   MRN: [id number]  HPI: Ifeanyichukwu Wickham is a 46 y.o.-year-old male, returning for f/u for DM2, dx 2011, insulin-dependent since 2014, uncontrolled, without complications. He moved from [place]. [place] in 07/2014.  PCP: Dr. Pershing Proud. Last visit 4 mo ago.  DM2: Last hemoglobin A1c was: Lab Results  Component Value Date   HGBA1C 8.1 08/22/2016   HGBA1C 8.1 04/11/2016   HGBA1C 7.6 11/10/2015  10/2014: HbA1c 9.5% 05/2014: HbA1c 8.5%  Pt was on a regimen of: - Metformin 850 >> 1000 mg po bid >> tolerates it well - Amaryl 2 mg 2x a day - Januvia 50 >> 100 mg daily (for 2-3 years) >> Tradjenta 5 mg in am - Lantus 25 >> 30 units qhs. Did not switch to Toujeo  He is on - Metformin 1000 mg 2x a day with meals - Glipizide 5 mg 2x a day before B and D (increase to 10 mg if you have a large meal) - Invokana 100 mg in am  - Levemir 30 units at bedtime He was on Bydureon 2 mg weekly >> nodules under skin at inj sites >> Tanzeum (stopped 2 weeks ago - ran out, not covered) >> Victoza 1.8 mg daily >> not covered  Pt checks his sugars 0-2x a day (no log, no meter):  - am: n/c >> 143-195 >> 130-190 >> 107-175 >> 120-160 >> 120-145 >> 110-140 >> 110-140 - 2h after b'fast: n/c >> 278 >> 203 >> 203 >> 140-180 >> 130-160 >> n/c - before lunch: n/c >> 183, 184 >> 236 >> 89-167 >> 100-140 >> 130-145 - 2h after lunch:  91, 150-196 >> 140-180 >> 180-220 (if dessert >> 245) >> 160-210 (178-183) >> same (after fruit) - before dinner: 157-231 >> 106-169 >> 100-140 >> 80s (when exercising in am), 130-140 >> n/c - 2h after dinner: n/c >> 200-211 >> 230-250 >> 147-191 >> 120-180 >> 180s >> 150-180 >> same - bedtime: n/c  - nighttime: n/c No lows. Lowest sugar was 80s-90s >> 80s after starting cinnamon water >> 79; he has hypoglycemia awareness at 70.  Highest sugar was 245 >> 300 if chocolate + frappe >> 210  Meter: GE (not covered by [facility]).  Pt's  meals are: - Breakfast: tea (unsweet) + cookies >> 12 grain bread + butter + tea  - Lunch: tortilla + vegetables, chicken/fish/seafood - Dinner: same - Snacks: 2 : protein bars mid-pm; post dinner: fruit, bar Occasionally cinnamon water.  - no CKD, last BUN/creatinine:  Lab Results  Component Value Date   BUN 13 04/11/2016   CREATININE 0.87 04/11/2016   - last set of lipids: Lab Results  Component Value Date   CHOL 143 08/22/2016   HDL 30.90 (L) 08/22/2016   LDLCALC 44 08/02/2015   LDLDIRECT 95.0 08/22/2016   TRIG 235.0 (H) 08/22/2016   CHOLHDL 5 08/22/2016  On Atorvastatin 20. - last eye exam was in 06/05/2016. No DR.  - no numbness and tingling in his feet.  He has hypothyroidism.   He is on LT4 50 mcg taken correctly: - in am - fasting - with water - 30 min before breakfast - takes MVI at night  Lab Results  Component Value Date   TSH 2.27 08/22/2016   ROS: Constitutional: no weight gain/loss, no increased appetite, no fatigue, no subjective hyperthermia/hypothermia Eyes: no blurry vision, no xerophthalmia ENT: no sore throat, no nodules palpated in throat, no dysphagia/odynophagia, no hoarseness  Cardiovascular: no CP/SOB/palpitations/leg swelling Respiratory: no cough/SOB Gastrointestinal: no N/V/D/C Musculoskeletal: no muscle/joint aches Skin: no rash, itching Neurological: no tremors/numbness/tingling/dizziness  I reviewed pt's medications, allergies, PMH, social hx, family hx, and changes were documented in the history of present illness. Otherwise, unchanged from my initial visit note, except started Naproxen for shoulder pain:  Past Medical History:  Diagnosis Date  . Diabetes mellitus without complication (HCC)   . Hyperlipidemia   . Thyroid disease    No past surgical history on file. History   Social History  . Marital Status: Married    Spouse Name: N/A    Number of Children: 2   Occupational History  . Sales assistant   Social History  Main Topics  . Smoking status: Never Smoker   . Smokeless tobacco: Not on file  . Alcohol Use: Yes  . Drug Use: No   Current Outpatient Prescriptions on File Prior to Visit  Medication Sig Dispense Refill  . atorvastatin (LIPITOR) 20 MG tablet Take 20 mg by mouth daily.    Marland Kitchen. glimepiride (AMARYL) 2 MG tablet Take 2 mg by mouth daily with breakfast.    . levothyroxine (SYNTHROID, LEVOTHROID) 50 MCG tablet Take 50 mcg by mouth daily before breakfast.    . sitaGLIPtin (JANUVIA) 50 MG tablet Take 50 mg by mouth daily.     Metformin 850 mg 2x a day  Allergies  Allergen Reactions  . Penicillins Hives and Swelling   FH: - see HPI - HL in M, F - cancer in M  PE: BP 118/72 (BP Location: Left Arm, Patient Position: Sitting)   Pulse 77   Ht 5\' 8"  (1.727 m)   Wt 173 lb (78.5 kg)   SpO2 98%   BMI 26.30 kg/m   Wt Readings from Last 3 Encounters:  12/19/16 173 lb (78.5 kg)  08/22/16 168 lb (76.2 kg)  04/11/16 171 lb (77.6 kg)   Constitutional: overweight, in NAD Eyes: PERRLA, EOMI, no exophthalmos ENT: moist mucous membranes, no thyromegaly, no cervical lymphadenopathy Cardiovascular: RRR, No MRG Respiratory: CTA B Gastrointestinal: abdomen soft, NT, ND, BS+ Musculoskeletal: no deformities, strength intact in all 4 Skin: moist, warm Neurological: no tremor with outstretched hands, DTR normal in all 4  ASSESSMENT: 1. DM2, insulin-dependent, uncontrolled, without complications - GLP1 R agonists were not approved.   2. Hypothyroidism  PLAN:  1. Patient with uncontrolled diabetes, on oral antidiabetic regimen + basal insulin. At last visit, his sugars started to improve after he started cinnamon water >> we did not change the regimen as he wanted to continue with this and also restart exercise. His sugars are approx the same as before (no log, no meter). However, prev. HbA1c 8.1% (higher) >> today: 7.7% (better) - as HbA1c is still high >> will try again to add Trulicity. If not  covered, may need Ozempic - advised him to: Patient Instructions  Please continue: - Metformin 1000 mg 2x a day with meal - Invokana 100 mg daily in am - Glipizide 5 mg 2x a day but move before b'fast and dinner. - Levemir 30 units at bedtime  Please start Trulicity 0.75 mg weekly. Let me know when you are close to running out to call in the higher dose to your pharmacy (1.5 mg).  Please come back for a follow-up appointment in 1.5 months.  - start checking sugars at different times of the day - check 1-2 times a day, rotating checks  - given logs - advised for yearly eye exams >>  he is UTD - had a flu shot this season - Return to clinic in 3 mo with sugar log   2. Hypothyroidism - he is taking LT4 50 mcg correctly >> continue this dose.  - reviewed last TSH - normal at last visit; will not repeat today  Carlus Pavlov, MD PhD [facility]

## 2016-12-19 NOTE — Addendum Note (Signed)
Addended by: Darene LamerHOMPSON, Malahki Gasaway T on: 12/19/2016 08:57 AM   Modules accepted: Orders

## 2016-12-19 NOTE — Patient Instructions (Addendum)
Please continue: - Metformin 1000 mg 2x a day with meal - Invokana 100 mg daily in am - Glipizide 5 mg 2x a day but move before b'fast and dinner. - Levemir 30 units at bedtime  Please start Trulicity 0.75 mg weekly. Let me know when you are close to running out to call in the higher dose to your pharmacy (1.5 mg).  Please come back for a follow-up appointment in 1.5 months.

## 2016-12-20 ENCOUNTER — Other Ambulatory Visit: Payer: Self-pay | Admitting: Internal Medicine

## 2017-01-03 ENCOUNTER — Other Ambulatory Visit: Payer: Self-pay | Admitting: Internal Medicine

## 2017-01-08 ENCOUNTER — Other Ambulatory Visit: Payer: Self-pay | Admitting: Internal Medicine

## 2017-01-08 ENCOUNTER — Other Ambulatory Visit: Payer: Self-pay

## 2017-01-08 MED ORDER — CANAGLIFLOZIN 100 MG PO TABS: ORAL_TABLET | ORAL | 2 refills | Status: DC

## 2017-02-09 ENCOUNTER — Other Ambulatory Visit: Payer: Self-pay | Admitting: Internal Medicine

## 2017-02-12 ENCOUNTER — Ambulatory Visit (INDEPENDENT_AMBULATORY_CARE_PROVIDER_SITE_OTHER): Payer: BLUE CROSS/BLUE SHIELD | Admitting: Family Medicine

## 2017-02-12 ENCOUNTER — Other Ambulatory Visit: Payer: Self-pay | Admitting: Internal Medicine

## 2017-02-12 ENCOUNTER — Encounter: Payer: Self-pay | Admitting: Family Medicine

## 2017-02-12 VITALS — BP 126/78 | HR 86 | Temp 98.1°F | Resp 16 | Ht 66.8 in | Wt 171.0 lb

## 2017-02-12 DIAGNOSIS — Z9109 Other allergy status, other than to drugs and biological substances: Secondary | ICD-10-CM

## 2017-02-12 DIAGNOSIS — R21 Rash and other nonspecific skin eruption: Secondary | ICD-10-CM | POA: Diagnosis not present

## 2017-02-12 MED ORDER — FLUTICASONE PROPIONATE 50 MCG/ACT NA SUSP: 2.0000 | Freq: Every day | NASAL | 0 refills | Status: DC

## 2017-02-12 MED ORDER — ALBUTEROL SULFATE HFA 108 (90 BASE) MCG/ACT IN AERS: 2.0000 | INHALATION_SPRAY | Freq: Four times a day (QID) | RESPIRATORY_TRACT | 0 refills | Status: DC | PRN

## 2017-02-12 MED ORDER — DESONIDE 0.05 % EX CREA: TOPICAL_CREAM | Freq: Two times a day (BID) | CUTANEOUS | 0 refills | Status: DC

## 2017-02-12 MED ORDER — EUCERIN EX CREA: TOPICAL_CREAM | CUTANEOUS | 0 refills | Status: DC | PRN

## 2017-02-12 MED ORDER — LORATADINE 10 MG PO TABS: 10.0000 mg | ORAL_TABLET | Freq: Every day | ORAL | 0 refills | Status: DC

## 2017-02-12 NOTE — Patient Instructions (Addendum)
Thank you so much for coming to visit today! I suspect your symptoms are secondary to allergies. Please stop taking Zyrtec--I have given you another medication for Claritin to take daily for allergies. I have also given you a prescription for Flonase and an albuterol inhaler. For your rash, I am giving you a stronger steroid called Desonide. Please return if your symptoms worsen or are not improved in 3-4 weeks.   Dr. Caroleen Hammanumley    IF you received an x-ray today, you will receive an invoice from [facility]. Please contact [facility] at [phone] with questions or concerns regarding your invoice.   IF you received labwork today, you will receive an invoice from [facility]. Please contact [facility] at [phone] with questions or concerns regarding your invoice.   Our billing staff will not be able to assist you with questions regarding bills from these companies.  You will be contacted with the lab results as soon as they are available. The fastest way to get your results is to activate your My Chart account. Instructions are located on the last page of this paperwork. If you have not heard from us regarding the results in 2 weeks, please contact this office.

## 2017-02-12 NOTE — Progress Notes (Signed)
Raymond Fitzgerald is a 46 y.o. male who presents to Primary Care at [facility] today for cough and rash.  1. Notes rash for one month. Present on flexure surfaces of knees and elbows and posterior neck. Itches. Painful when drying with towel. Has tried over the counter cortisone cream without relief. Reports prior history of rash 2 years ago which resolved with cortisone, however this looks different. Rash at that time was believed to be associated with severe allergies.   2 Reports symptoms of cough, mild wheezing, congestion. Reports similar to prior allergy flares. Has been taking Zyrtec with some relief but minimal. Has previous albuterol inhaler which was prescribed in the past for cough, but his pump is expired and is not working. Requests refill of albuterol.   ROS as above.  Pertinently, no chest pain, palpitations, SOB, Fever, Chills, Abd pain, N/V/D.   PMH reviewed. Patient is a nonsmoker.   Past Medical History:  Diagnosis Date  . Diabetes mellitus without complication (HCC)   . Hyperlipidemia   . Thyroid disease    No past surgical history on file.  Medications reviewed. Current Outpatient Prescriptions  Medication Sig Dispense Refill  . atorvastatin (LIPITOR) 20 MG tablet TAKE 1 TABLET BY MOUTH EVERY DAY 90 tablet 0  . BD PEN NEEDLE NANO U/F 32G X 4 MM MISC USE ONCE DAILY 100 each 0  . canagliflozin (INVOKANA) 100 MG TABS tablet TAKE 1 TABLET BY MOUTH DAILY BEFORE BREAKFAST 30 tablet 2  . Dulaglutide (TRULICITY) 0.75 MG/0.5ML SOPN Inject 0.75 mg under skin weekly 4 pen 1  . glipiZIDE (GLUCOTROL) 5 MG tablet TAKE 1 TABLET(5 MG) BY MOUTH TWICE DAILY BEFORE A MEAL 180 tablet 0  . glucose blood (ONETOUCH VERIO) test strip Use to test blood sugar 2 times daily as instructed 200 each 0  . LEVEMIR FLEXTOUCH 100 UNIT/ML Pen INJECT 30 UNITS UNDER THE SKIN AT BEDTIME 15 mL 0  . levothyroxine (SYNTHROID, LEVOTHROID) 50 MCG tablet TAKE 1 TABLET(50 MCG) BY MOUTH DAILY BEFORE BREAKFAST 90  tablet 0  . metFORMIN (GLUCOPHAGE) 1000 MG tablet TAKE 1 TABLET(1000 MG) BY MOUTH TWICE DAILY WITH A MEAL 180 tablet 0  . ONETOUCH DELICA LANCETS 33G MISC CHECK BLOOD SUGAR TWICE DAILY AS DIRECTED 100 each 3  . atorvastatin (LIPITOR) 20 MG tablet TAKE 1 TABLET(20 MG) BY MOUTH DAILY 90 tablet 0  . chlorhexidine (PERIDEX) 0.12 % solution   2  . glipiZIDE (GLUCOTROL) 5 MG tablet TAKE 1 TABLET(5 MG) BY MOUTH TWICE DAILY BEFORE A MEAL 180 tablet 0  . metFORMIN (GLUCOPHAGE) 1000 MG tablet TAKE 1 TABLET(1000 MG) BY MOUTH TWICE DAILY WITH A MEAL 180 tablet 0  . naproxen (NAPROSYN) 500 MG tablet Take 500 mg by mouth 2 (two) times daily with a meal.     No current facility-administered medications for this visit.    Physical Exam:  BP 126/78 (BP Location: Right Arm, Patient Position: Sitting, Cuff Size: Normal)   Pulse 86   Temp 98.1 F (36.7 C) (Oral)   Resp 16   Ht 5' 6.8" (1.697 m)   Wt 171 lb (77.6 kg)   SpO2 98%   BMI 26.94 kg/m  Gen:  Alert, cooperative patient who appears stated age in no acute distress.  Vital signs reviewed. HEENT: EOMI,  moist mucous membranes, pink nasal conchae, normal tympanic membranes with normal middle ear aeration. Pulm:  Clear to auscultation bilaterally with good air movement.  No wheezes or rales noted.   Cardiac:  Regular rate and rhythm without murmur auscultated.  Good S1/S2. Abd:  Soft/nondistended/nontender.  Good bowel sounds throughout all four quadrants.  No masses noted.  Exts: Non edematous BL  LE, warm and well perfused.  Skin: Plaques with pink base and faint silver scales noted on flexure surfaces of bilateral knees and elbows and posterior neck.  Assessment and Plan: 1. Rash and nonspecific skin eruption Components of psoriasis noted. Desonide and Eucerin prescribed. Follow up if no improvement in 3-4 weeks.  2. Environmental allergies Discontinue Zyrtec, prescription for Claritin given. Flonase prescribed. Albuterol refilled. Differential  includes URI. To return if no improvement.

## 2017-02-19 ENCOUNTER — Encounter: Payer: Self-pay | Admitting: Internal Medicine

## 2017-02-19 ENCOUNTER — Ambulatory Visit (INDEPENDENT_AMBULATORY_CARE_PROVIDER_SITE_OTHER): Payer: BLUE CROSS/BLUE SHIELD | Admitting: Internal Medicine

## 2017-02-19 VITALS — BP 112/62 | HR 91 | Wt 170.0 lb

## 2017-02-19 DIAGNOSIS — Z794 Long term (current) use of insulin: Secondary | ICD-10-CM

## 2017-02-19 DIAGNOSIS — E039 Hypothyroidism, unspecified: Secondary | ICD-10-CM | POA: Diagnosis not present

## 2017-02-19 DIAGNOSIS — IMO0001 Reserved for inherently not codable concepts without codable children: Secondary | ICD-10-CM

## 2017-02-19 DIAGNOSIS — E1165 Type 2 diabetes mellitus with hyperglycemia: Secondary | ICD-10-CM

## 2017-02-19 MED ORDER — DULAGLUTIDE 1.5 MG/0.5ML ~~LOC~~ SOAJ: SUBCUTANEOUS | 11 refills | Status: DC

## 2017-02-19 NOTE — Progress Notes (Signed)
Patient ID: Raymond Fitzgerald, male   DOB: 1971-09-25, 46 y.o.   MRN: [id number]  HPI: Raymond Fitzgerald is a 46 y.o.-year-old male, returning for f/u for DM2, dx 2011, insulin-dependent since 2014, uncontrolled, without complications. [place]. Last OV 2 mo ago. PCP: Dr. Pershing ProudWilliam Fitzgerald. Last visit 4 mo ago.  DM2: Last hemoglobin A1c was: Lab Results  Component Value Date   HGBA1C 7.7 12/19/2016   HGBA1C 8.1 08/22/2016   HGBA1C 8.1 04/11/2016  10/2014: HbA1c 9.5% 05/2014: HbA1c 8.5%  Pt was on a regimen of: - Metformin 850 >> 1000 mg po bid >> tolerates it well - Amaryl 2 mg 2x a day - Januvia 50 >> 100 mg daily (for 2-3 years) >> Tradjenta 5 mg in am - Lantus 25 >> 30 units qhs. Did not switch to Toujeo  He is on - Metformin 1000 mg 2x a day with meals - Glipizide 5 mg 2x a day before B and D (increase to 10 mg if you have a large meal) - Invokana 100 mg in am  - Levemir 30 units at bedtime - Trulicity 0.75 mg daily. He was on Bydureon 2 mg weekly >> nodules under skin at inj sites >> Tanzeum (stopped 2 weeks ago - ran out, not covered) >> Victoza 1.8 mg daily >> not covered  Pt checks his sugars 2x a day (per review of log): - am:  120-160 >> 120-145 >> 110-140 >> 110-140 >> 135-145 - 2h after b'fast:  203 >> 203 >> 140-180 >> 130-160 >> n/c >> 135, 185 - before lunch:236 >> 89-167 >> 100-140 >> 130-145 >> 103-122 - 2h after lunch:  160-210 (178-183) >> same (after fruit) >> 170, 194 (icecream) - before dinner:80s (when exercising in am), 130-140 >> n/c >> 95-126, 140 - 2h after dinner: 120-180 >> 180s >> 150-180 >> same >> 185-195 (icecream) - bedtime: n/c  - nighttime: n/c No lows. Lowest sugar was 95; he has hypoglycemia awareness at 70.  Highest sugar was 195  Meter: GE (not [facility]).  Pt's meals are: - Breakfast: tea (unsweet) + cookies >> 12 grain bread + butter + tea  - Lunch: tortilla + vegetables, chicken/fish/seafood - Dinner:  same - Snacks: 2 : protein bars mid-pm; post dinner: fruit, bar Occasionally cinnamon water.  - no CKD, last BUN/creatinine:  Lab Results  Component Value Date   BUN 13 04/11/2016   CREATININE 0.87 04/11/2016   - last set of lipids: Lab Results  Component Value Date   CHOL 143 08/22/2016   HDL 30.90 (L) 08/22/2016   LDLCALC 44 08/02/2015   LDLDIRECT 95.0 08/22/2016   TRIG 235.0 (H) 08/22/2016   CHOLHDL 5 08/22/2016  On Atorvastatin 20. - last eye exam was in 05/2016. No DR. - denies numbness and tingling in his feet.  He has hypothyroidism.   Pt is on levothyroxine 50 mcg daily, taken: - in am - fasting - at least 30 min from b'fast - no Ca, Fe, PPIs - + MVI at night - not on Biotin   Lab Results  Component Value Date   TSH 2.27 08/22/2016   ROS: Constitutional: no weight gain/no weight loss, no fatigue, no subjective hyperthermia, no subjective hypothermia Eyes: no blurry vision, no xerophthalmia ENT: no sore throat, no nodules palpated in throat, no dysphagia, no odynophagia, no hoarseness Cardiovascular: no CP/no SOB/no palpitations/no leg swelling Respiratory: no cough/no SOB/no wheezing Gastrointestinal: no N/no V/no D/no C/no acid reflux Musculoskeletal:  no muscle aches/no joint aches Skin: no rashes, no hair loss Neurological: no tremors/no numbness/no tingling/no dizziness  I reviewed pt's medications, allergies, PMH, social hx, family hx, and changes were documented in the history of present illness. Otherwise, unchanged from my initial visit note.  Past Medical History:  Diagnosis Date  . Diabetes mellitus without complication (HCC)   . Hyperlipidemia   . Thyroid disease    No past surgical history on file. History   Social History  . Marital Status: Married    Spouse Name: N/A    Number of Children: 2   Occupational History  . Sales assistant   Social History Main Topics  . Smoking status: Never Smoker   . Smokeless tobacco: Not on file   . Alcohol Use: Yes  . Drug Use: No   Current Outpatient Prescriptions on File Prior to Visit  Medication Sig Dispense Refill  . atorvastatin (LIPITOR) 20 MG tablet Take 20 mg by mouth daily.    Marland Kitchen glimepiride (AMARYL) 2 MG tablet Take 2 mg by mouth daily with breakfast.    . levothyroxine (SYNTHROID, LEVOTHROID) 50 MCG tablet Take 50 mcg by mouth daily before breakfast.    . sitaGLIPtin (JANUVIA) 50 MG tablet Take 50 mg by mouth daily.     Metformin 850 mg 2x a day  Allergies  Allergen Reactions  . Penicillins Hives and Swelling   FH: - see HPI - HL in M, F - cancer in M  PE: BP 112/62 (BP Location: Left Arm, Patient Position: Sitting)   Pulse 91   Wt 170 lb (77.1 kg)   SpO2 97%   BMI 26.79 kg/m   Wt Readings from Last 3 Encounters:  02/19/17 170 lb (77.1 kg)  02/12/17 171 lb (77.6 kg)  12/19/16 173 lb (78.5 kg)   Constitutional:normal weight, in NAD Eyes: PERRLA, EOMI, no exophthalmos ENT: moist mucous membranes, no thyromegaly, no cervical lymphadenopathy Cardiovascular: RRR, No MRG Respiratory: CTA B Gastrointestinal: abdomen soft, NT, ND, BS+ Musculoskeletal: no deformities, strength intact in all 4 Skin: moist, warm, no rashes Neurological: no tremor with outstretched hands, DTR normal in all 4  ASSESSMENT: 1. DM2, insulin-dependent, uncontrolled, without complications - GLP1 R agonists were not approved.   2. Hypothyroidism  PLAN:  1. Patient with uncontrolled diabetes, on oral antidiabetic regimen + basal insulin + we added Trulicity at last visit >> sugars are better, but still higher after meals >> will increase the dose of Trulicity. - again discussed about improving diet - advised him to: Patient Instructions  Please continue: - Metformin 1000 mg 2x a day with meal - Invokana 100 mg daily in am - Glipizide 5 mg 2x a day but move before b'fast and dinner. - Levemir 30 units at bedtime  Please increase: - Trulicity 1.5 mg weekly.  Please come  back for a follow-up appointment in 3 months.  - last HbA1c was 7.7%  - continue checking sugars at different times of the day - check 1-2x a day, rotating checks - advised for yearly eye exams >> he is UTD - Return to clinic in 3 mo with sugar log   2. Hypothyroidism - latest thyroid labs reviewed with pt >> normal  - he continues on LT4 50 mcg daily - pt feels good on this dose. - we discussed about taking the thyroid hormone every day, with water, >30 minutes before breakfast, separated by >4 hours from acid reflux medications, calcium, iron, multivitamins. Pt. is taking it correctly  Philemon Kingdom, MD PhD [facility]

## 2017-02-19 NOTE — Patient Instructions (Addendum)
Please continue: - Metformin 1000 mg 2x a day with meal - Invokana 100 mg daily in am - Glipizide 5 mg 2x a day but move before b'fast and dinner. - Levemir 30 units at bedtime  Please increase: - Trulicity 1.5 mg weekly.  Please come back for a follow-up appointment in 3 months.

## 2017-03-10 ENCOUNTER — Other Ambulatory Visit: Payer: Self-pay | Admitting: Internal Medicine

## 2017-03-28 ENCOUNTER — Other Ambulatory Visit: Payer: Self-pay | Admitting: Internal Medicine

## 2017-04-05 ENCOUNTER — Other Ambulatory Visit: Payer: Self-pay | Admitting: Internal Medicine

## 2017-04-11 ENCOUNTER — Other Ambulatory Visit: Payer: Self-pay | Admitting: Internal Medicine

## 2017-05-13 ENCOUNTER — Other Ambulatory Visit: Payer: Self-pay | Admitting: Internal Medicine

## 2017-06-04 ENCOUNTER — Ambulatory Visit (INDEPENDENT_AMBULATORY_CARE_PROVIDER_SITE_OTHER): Payer: BLUE CROSS/BLUE SHIELD | Admitting: Internal Medicine

## 2017-06-04 ENCOUNTER — Encounter: Payer: Self-pay | Admitting: Internal Medicine

## 2017-06-04 VITALS — BP 124/74 | HR 80 | Ht 68.0 in | Wt 164.0 lb

## 2017-06-04 DIAGNOSIS — Z23 Encounter for immunization: Secondary | ICD-10-CM

## 2017-06-04 DIAGNOSIS — E1165 Type 2 diabetes mellitus with hyperglycemia: Secondary | ICD-10-CM | POA: Diagnosis not present

## 2017-06-04 DIAGNOSIS — Z794 Long term (current) use of insulin: Secondary | ICD-10-CM | POA: Diagnosis not present

## 2017-06-04 DIAGNOSIS — IMO0001 Reserved for inherently not codable concepts without codable children: Secondary | ICD-10-CM

## 2017-06-04 DIAGNOSIS — E039 Hypothyroidism, unspecified: Secondary | ICD-10-CM | POA: Diagnosis not present

## 2017-06-04 LAB — LIPID PANEL
CHOL/HDL RATIO: 5
Cholesterol: 117 mg/dL (ref 0–200)
HDL: 25 mg/dL — AB (ref >39.00)
LDL Cholesterol: 59 mg/dL (ref 0–99)
NONHDL: 91.58
Triglycerides: 164 mg/dL — ABNORMAL HIGH (ref 0.0–149.0)
VLDL: 32.8 mg/dL (ref 0.0–40.0)

## 2017-06-04 LAB — MICROALBUMIN / CREATININE URINE RATIO
CREATININE, U: 69.2 mg/dL
Microalb Creat Ratio: 1 mg/g (ref 0.0–30.0)

## 2017-06-04 LAB — TSH: TSH: 3.23 u[IU]/mL (ref 0.35–4.50)

## 2017-06-04 LAB — POCT GLYCOSYLATED HEMOGLOBIN (HGB A1C): Hemoglobin A1C: 5.9

## 2017-06-04 MED ORDER — GLIPIZIDE 5 MG PO TABS: ORAL_TABLET | ORAL | 5 refills | Status: DC

## 2017-06-04 NOTE — Patient Instructions (Addendum)
Please continue: - Metformin 1000 mg 2x a day with meal - Invokana 100 mg daily in am - Levemir 30 units at bedtime - Trulicity 1.5 mg weekly.  Please stop Glipizide, but may take 1 tablet before dinner if you have a large meal.  Please stop at the lab.  Please come back for a follow-up appointment in 3 months.

## 2017-06-04 NOTE — Progress Notes (Signed)
Patient ID: Raymond Fitzgerald, male   DOB: [birth year], 46 y.o.   MRN: [id number]  HPI: Raymond Fitzgerald is a 46 y.o.-year-old male, returning for f/u for DM2, dx 2011, insulin-dependent since 2014, uncontrolled, without complications and also hypothyroidism. He moved from [place]. [place] in 07/2014.  PCP: Dr. Pershing Proud. Last visit 5 mo ago.  DM2: Last hemoglobin A1c was: Lab Results  Component Value Date   HGBA1C 7.7 12/19/2016   HGBA1C 8.1 08/22/2016   HGBA1C 8.1 04/11/2016  10/2014: HbA1c 9.5% 05/2014: HbA1c 8.5%  He is on: - Metformin 1000 mg 2x a day with meals - Glipizide 5 mg 2x a day before B and D (increase to 10 mg if you have a large meal) - Invokana 100 mg in am  - Levemir 30 units at bedtime - Trulicity 1.5 mg daily. He was on Bydureon 2 mg weekly >> nodules under skin at inj sites >> Tanzeum >> Victoza 1.8 mg daily >> not covered  Pt checks his sugars 2x a day >> we reviewed his log: - am:  120-160 >> 120-145 >> 110-140 >> 110-140 >> 135-145 >> 135 - 2h after b'fast:  203 >> 203 >> 140-180 >> 130-160 >> n/c >> 135, 185 >> 79, 106-146 - before lunch:236 >> 89-167 >> 100-140 >> 130-145 >> 103-122 >> 77-108 - 2h after lunch:  same (after fruit) >> 170, 194 (icecream) >> 102-170 (high with dessert) - before dinner:80s (when exercising in am), 130-140 >> n/c >> 95-126, 140 >> 125 - 2h after dinner: 120-180 >> 180s >> 150-180 >> same >> 185-195 (icecream) >> n/c - bedtime: n/c  - nighttime: n/c Lowest sugar was 95 >> 77; he has hypoglycemia awareness at 70.  Highest sugar was 195 >> 170  Meter: GE (not covered by [facility]).  Pt's meals are: - Breakfast: tea (unsweet) + cookies >> 12 grain bread + butter + tea  - Lunch: tortilla + vegetables, chicken/fish/seafood - Dinner: same - Snacks: 2 : protein bars mid-pm; post dinner: fruit, bar Occasionally cinnamon water.  - No CKD, last BUN/creatinine:  Lab Results  Component Value Date   BUN 13 04/11/2016   CREATININE 0.87  04/11/2016  Not on an ACEI/ARB.  No results found for: MICRALBCREAT   - last set of lipids: Lab Results  Component Value Date   CHOL 143 08/22/2016   HDL 30.90 (L) 08/22/2016   LDLCALC 44 08/02/2015   LDLDIRECT 95.0 08/22/2016   TRIG 235.0 (H) 08/22/2016   CHOLHDL 5 08/22/2016  On Atorvastatin 20. + Fish oil occas. - last eye exam was in 05/2016 >> No DR.  - he deniesnumbness and tingling in his feet.  He has hypothyroidism.   Pt is on levothyroxine 50 mcg daily, taken: - in am - fasting - at least 30 min from b'fast - no Ca, Fe, PPIs - + MVI at night - not on Biotin  Lab Results  Component Value Date   TSH 2.27 08/22/2016   ROS: Constitutional: no weight gain/no weight loss, no fatigue, no subjective hyperthermia, no subjective hypothermia Eyes: no blurry vision, no xerophthalmia ENT: no sore throat, no nodules palpated in throat, no dysphagia, no odynophagia, no hoarseness Cardiovascular: no CP/no SOB/no palpitations/no leg swelling Respiratory: no cough/no SOB/no wheezing Gastrointestinal: no N/no V/no D/no C/no acid reflux Musculoskeletal: no muscle aches/no joint aches Skin: no rashes, no hair loss Neurological: no tremors/no numbness/no tingling/no dizziness  I reviewed pt's medications, allergies, PMH, social hx, family hx, and changes were documented  in the history of present illness. Otherwise, unchanged from my initial visit note.   Past Medical History:  Diagnosis Date  . Diabetes mellitus without complication (HCC)   . Hyperlipidemia   . Thyroid disease    No past surgical history on file. History   Social History  . Marital Status: Married    Spouse Name: N/A    Number of Children: 2   Occupational History  . Sales assistant   Social History Main Topics  . Smoking status: Never Smoker   . Smokeless tobacco: Not on file  . Alcohol Use: Yes  . Drug Use: No   Current Outpatient Prescriptions  Medication Sig Dispense Refill  . albuterol  (PROVENTIL HFA;VENTOLIN HFA) 108 (90 Base) MCG/ACT inhaler Inhale 2 puffs into the lungs every 6 (six) hours as needed for wheezing or shortness of breath. 1 Inhaler 0  . atorvastatin (LIPITOR) 20 MG tablet TAKE 1 TABLET BY MOUTH EVERY DAY 90 tablet 0  . BD PEN NEEDLE NANO U/F 32G X 4 MM MISC USE ONCE DAILY 100 each 0  . chlorhexidine (PERIDEX) 0.12 % solution   2  . desonide (DESOWEN) 0.05 % cream Apply topically 2 (two) times daily. 30 g 0  . Dulaglutide (TRULICITY) 1.5 MG/0.5ML SOPN Inject 1.5 mg under skin weekly 4 pen 11  . fluticasone (FLONASE) 50 MCG/ACT nasal spray Place 2 sprays into both nostrils daily. 16 g 0  . glipiZIDE (GLUCOTROL) 5 MG tablet TAKE 1 TABLET(5 MG) BY MOUTH TWICE DAILY BEFORE A MEAL 180 tablet 0  . glucose blood (ONETOUCH VERIO) test strip Use to test blood sugar 2 times daily as instructed 200 each 0  . INVOKANA 100 MG TABS tablet TAKE 1 TABLET BY MOUTH DAILY BEFORE BREAKFAST. PATIENT NEEDS APPOINTMENT 30 tablet 0  . LEVEMIR FLEXTOUCH 100 UNIT/ML Pen INJECT 30 UNITS UNDER THE SKIN AT BEDTIME 15 mL 0  . levothyroxine (SYNTHROID, LEVOTHROID) 50 MCG tablet TAKE 1 TABLET(50 MCG) BY MOUTH DAILY BEFORE BREAKFAST 90 tablet 0  . loratadine (CLARITIN) 10 MG tablet Take 1 tablet (10 mg total) by mouth daily. 60 tablet 0  . metFORMIN (GLUCOPHAGE) 1000 MG tablet TAKE 1 TABLET(1000 MG) BY MOUTH TWICE DAILY WITH A MEAL 180 tablet 0  . ONETOUCH DELICA LANCETS 33G MISC CHECK BLOOD SUGAR TWICE DAILY AS DIRECTED 100 each 3  . Skin Protectants, Misc. (EUCERIN) cream Apply topically as needed for dry skin. 454 g 0   No current facility-administered medications for this visit.      Allergies  Allergen Reactions  . Penicillins Hives and Swelling   FH: - see HPI - HL in M, F - cancer in M  PE: BP 124/74 (BP Location: Left Arm, Patient Position: Sitting)   Pulse 80   Ht 5\' 8"  (1.727 m)   Wt 164 lb (74.4 kg)   BMI 24.94 kg/m   Wt Readings from Last 3 Encounters:  06/04/17  164 lb (74.4 kg)  02/19/17 170 lb (77.1 kg)  02/12/17 171 lb (77.6 kg)   Constitutional: normal weight, in NAD Eyes: PERRLA, EOMI, no exophthalmos ENT: moist mucous membranes, no thyromegaly, no cervical lymphadenopathy Cardiovascular: RRR, No MRG Respiratory: CTA B Gastrointestinal: abdomen soft, NT, ND, BS+ Musculoskeletal: no deformities, strength intact in all 4 Skin: moist, warm, no rashes Neurological: no tremor with outstretched hands, DTR normal in all 4 Foot exam: sensation intact to light touch, no lesions or deformities. Good pedal pulses.  ASSESSMENT: 1. DM2, insulin-dependent, uncontrolled, without long  term complications  2. Hypothyroidism  PLAN:  1. Patient with uncontrolled diabetes, on oral antidiabetic regimen and GLP1 R agonist >> dose increased at last visit >> sugars much better, even has lows mid-day >> will stop Glipizide unless he eats a large meal - will give him a new meter (has one at work) to check sugars at home and at work - again discussed about improving diet - advised him to: Patient Instructions  Please continue: - Metformin 1000 mg 2x a day with meal - Invokana 100 mg daily in am - Levemir 30 units at bedtime - Trulicity 1.5 mg weekly.  Please stop Glipizide, but may take 1 tablet before dinner if you have a large meal.  Please stop at the lab.  Please come back for a follow-up appointment in 3 months.  - today, HbA1c is 5.9% (much better) - continue checking sugars at different times of the day - check 1x a day, rotating checks - advised for yearly eye exams >> he is UTD - will give PNA and fu shot today - Return to clinic in 3 mo with sugar log    2. Hypothyroidism - latest thyroid labs reviewed with pt >> normal  - he continues on LT4 50 mcg daily - pt feels good on this dose. - we discussed about taking the thyroid hormone every day, with water, >30 minutes before breakfast, separated by >4 hours from acid reflux medications,  calcium, iron, multivitamins. Pt. is taking it correctly - will check thyroid tests today: TSH  - If labs are abnormal, he will need to return for repeat TFTs in 1.5 months - OTW, RTC in 3 mo  Office Visit on 06/04/2017  Component Date Value Ref Range Status  . Microalb, Ur 06/04/2017 <0.7  0.0 - 1.9 mg/dL Final  . Creatinine,U 16/10/960408/29/2018 69.2  mg/dL Final  . Microalb Creat Ratio 06/04/2017 1.0  0.0 - 30.0 mg/g Final  . Sodium 06/04/2017 140  135 - 146 mmol/L Final  . Potassium 06/04/2017 4.9  3.5 - 5.3 mmol/L Final  . Chloride 06/04/2017 102  98 - 110 mmol/L Final  . CO2 06/04/2017 23  20 - 32 mmol/L Final   Comment: ** Please note change in reference range(s). **     . Glucose, Bld 06/04/2017 115* 65 - 99 mg/dL Final  . BUN 54/09/811908/29/2018 14  7 - 25 mg/dL Final  . Creat 14/78/295608/29/2018 0.90  0.60 - 1.35 mg/dL Final  . Total Bilirubin 06/04/2017 0.5  0.2 - 1.2 mg/dL Final  . Alkaline Phosphatase 06/04/2017 77  40 - 115 U/L Final  . AST 06/04/2017 16  10 - 40 U/L Final  . ALT 06/04/2017 16  9 - 46 U/L Final  . Total Protein 06/04/2017 7.1  6.1 - 8.1 g/dL Final  . Albumin 21/30/865708/29/2018 4.6  3.6 - 5.1 g/dL Final  . Calcium 84/69/629508/29/2018 9.5  8.6 - 10.3 mg/dL Final  . GFR, Est African American 06/04/2017 >89  >=60 mL/min Final  . GFR, Est Non African American 06/04/2017 >89  >=60 mL/min Final  . Cholesterol 06/04/2017 117  0 - 200 mg/dL Final   ATP III Classification       Desirable:  < 200 mg/dL               Borderline High:  200 - 239 mg/dL          High:  > = 284240 mg/dL  . Triglycerides 06/04/2017 164.0* 0.0 - 149.0 mg/dL Final  Normal:  <150 mg/dLBorderline High:  150 - 199 mg/dL  . HDL 06/04/2017 25.00* >39.00 mg/dL Final  . VLDL 16/07/9603 32.8  0.0 - 40.0 mg/dL Final  . LDL Cholesterol 06/04/2017 59  0 - 99 mg/dL Final  . Total CHOL/HDL Ratio 06/04/2017 5   Final                  Men          Women1/2 Average Risk     3.4          3.3Average Risk          5.0          4.42X Average Risk           9.6          7.13X Average Risk          15.0          11.0                      . NonHDL 06/04/2017 91.58   Final   NOTE:  Non-HDL goal should be 30 mg/dL higher than patient's LDL goal (i.e. LDL goal of < 70 mg/dL, would have non-HDL goal of < 100 mg/dL)  . TSH 06/04/2017 3.23  0.35 - 4.50 uIU/mL Final  . Hemoglobin A1C 06/04/2017 5.9   Final     Carlus Pavlov, MD PhD [facility] Endocrinology

## 2017-06-05 LAB — COMPLETE METABOLIC PANEL WITH GFR
ALBUMIN: 4.6 g/dL (ref 3.6–5.1)
ALK PHOS: 77 U/L (ref 40–115)
ALT: 16 U/L (ref 9–46)
AST: 16 U/L (ref 10–40)
BUN: 14 mg/dL (ref 7–25)
CALCIUM: 9.5 mg/dL (ref 8.6–10.3)
CHLORIDE: 102 mmol/L (ref 98–110)
CO2: 23 mmol/L (ref 20–32)
Creat: 0.9 mg/dL (ref 0.60–1.35)
GFR, Est African American: >89 mL/min (ref >=60)
Glucose, Bld: 115 mg/dL — ABNORMAL HIGH (ref 65–99)
POTASSIUM: 4.9 mmol/L (ref 3.5–5.3)
SODIUM: 140 mmol/L (ref 135–146)
Total Bilirubin: 0.5 mg/dL (ref 0.2–1.2)
Total Protein: 7.1 g/dL (ref 6.1–8.1)

## 2017-07-01 ENCOUNTER — Other Ambulatory Visit: Payer: Self-pay | Admitting: Internal Medicine

## 2017-07-09 ENCOUNTER — Other Ambulatory Visit: Payer: Self-pay | Admitting: Internal Medicine

## 2017-07-13 ENCOUNTER — Other Ambulatory Visit: Payer: Self-pay | Admitting: Internal Medicine

## 2017-08-16 ENCOUNTER — Other Ambulatory Visit: Payer: Self-pay | Admitting: Internal Medicine

## 2017-08-18 ENCOUNTER — Telehealth: Payer: Self-pay

## 2017-08-18 NOTE — Telephone Encounter (Signed)
Patient needs new prescription for onetouch verio test strips [facility] [address]

## 2017-08-19 ENCOUNTER — Other Ambulatory Visit: Payer: Self-pay

## 2017-08-19 MED ORDER — GLUCOSE BLOOD VI STRP: ORAL_STRIP | 0 refills | Status: DC

## 2017-08-19 NOTE — Telephone Encounter (Signed)
Submitted

## 2017-08-20 ENCOUNTER — Other Ambulatory Visit: Payer: Self-pay | Admitting: Internal Medicine

## 2017-08-22 ENCOUNTER — Other Ambulatory Visit: Payer: Self-pay | Admitting: Internal Medicine

## 2017-08-25 ENCOUNTER — Other Ambulatory Visit: Payer: Self-pay | Admitting: Family Medicine

## 2017-08-25 MED ORDER — CANAGLIFLOZIN 100 MG PO TABS: ORAL_TABLET | ORAL | 2 refills | Status: DC

## 2017-09-04 ENCOUNTER — Ambulatory Visit: Payer: BLUE CROSS/BLUE SHIELD | Admitting: Internal Medicine

## 2017-09-04 ENCOUNTER — Encounter: Payer: Self-pay | Admitting: Internal Medicine

## 2017-09-04 VITALS — BP 108/62 | HR 81 | Wt 161.8 lb

## 2017-09-04 DIAGNOSIS — E1165 Type 2 diabetes mellitus with hyperglycemia: Secondary | ICD-10-CM | POA: Diagnosis not present

## 2017-09-04 DIAGNOSIS — E039 Hypothyroidism, unspecified: Secondary | ICD-10-CM

## 2017-09-04 DIAGNOSIS — Z23 Encounter for immunization: Secondary | ICD-10-CM | POA: Diagnosis not present

## 2017-09-04 DIAGNOSIS — E785 Hyperlipidemia, unspecified: Secondary | ICD-10-CM

## 2017-09-04 DIAGNOSIS — IMO0001 Reserved for inherently not codable concepts without codable children: Secondary | ICD-10-CM

## 2017-09-04 LAB — POCT GLYCOSYLATED HEMOGLOBIN (HGB A1C): HEMOGLOBIN A1C: 6.4

## 2017-09-04 MED ORDER — CANAGLIFLOZIN 100 MG PO TABS: ORAL_TABLET | ORAL | 3 refills | Status: DC

## 2017-09-04 MED ORDER — INSULIN DETEMIR 100 UNIT/ML FLEXPEN: PEN_INJECTOR | SUBCUTANEOUS | 5 refills | Status: DC

## 2017-09-04 NOTE — Progress Notes (Signed)
Patient ID: Raymond Fitzgerald, male   DOB: July 20, 1971, 46 y.o.   MRN: [id number]  HPI: Raymond Fitzgerald is a 46 y.o.-year-old male, returning for f/u for DM2, dx 2011, insulin-dependent since 2014, uncontrolled, without long term complications and also hypothyroidism. He moved from [place]. [place] in 07/2014.  PCP: Dr. Pershing Proud. Last visit 3 mo ago.  DM2: Last hemoglobin A1c was: Lab Results  Component Value Date   HGBA1C 5.9 06/04/2017   HGBA1C 7.7 12/19/2016   HGBA1C 8.1 08/22/2016  10/2014: HbA1c 9.5% 05/2014: HbA1c 8.5%  He is on: - Metformin 1000 mg 2x a day with meal - Invokana 100 mg daily in am - Levemir 30 units at bedtime - Trulicity 1.5 mg weekly >> 0.75 (by mistake, he was given 0.75 mg from pharmacy) Uses Glipizide 5 mg before a larger dinner - very rarely. He was on Bydureon 2 mg weekly >> nodules under skin at inj sites >> Tanzeum >> Victoza 1.8 mg daily >> not covered  Pt checks his sugars 2x a day - reviewed log: - am:  110-140 >> 135-145 >> 135 >> 128 - 2h after b'fast:  135, 185 >> 79, 106-146 >> 147 - before lunch: 103-122 >> 77-108 >> 86, 92 - 2h after lunch: 170, 194 >> 102-170 (dessert) >> 160, 175 - before dinner: n/c >> 95-126, 140 >> 125 >> 109 - 2h after dinner: 185-195 (icecream) >> n/c - bedtime: n/c  - nighttime: n/c Lowest sugar was 95 >> 77 >> 64; he has hypoglycemia awareness at 70.  Highest sugar was 195 >> 170 >> 175.  Meter: GE (not covered by [facility]).  Pt's meals are: - Breakfast: tea (unsweet) + cookies >> 12 grain bread + butter + tea  - Lunch: tortilla + vegetables, chicken/fish/seafood - Dinner: same - Snacks: 2 : protein bars mid-pm; post dinner: fruit, bar Occasionally cinnamon water >> not lately  - No CKD, last BUN/creatinine:  Lab Results  Component Value Date   BUN 14 06/04/2017   CREATININE 0.90 06/04/2017  Not on ACEI/ARB.  No MAU: Lab Results  Component Value Date   MICRALBCREAT 1.0 06/04/2017    - + HL; last set  of lipids: Lab Results  Component Value Date   CHOL 117 06/04/2017   HDL 25.00 (L) 06/04/2017   LDLCALC 59 06/04/2017   LDLDIRECT 95.0 08/22/2016   TRIG 164.0 (H) 06/04/2017   CHOLHDL 5 06/04/2017  On Lipitor 20 + occasional fish oil - last eye exam was in 05/2016 >> No DR - no numbness and tingling in his feet.  He has hypothyroidism.   Pt is on levothyroxine 50 mcg daily, taken: - in am - fasting - at least 30 min from b'fast - no Ca, Fe, PPIs - + MVI at night - not on Biotin  Lab Results  Component Value Date   TSH 3.23 06/04/2017   Pt denies: - feeling nodules in neck - hoarseness - dysphagia - choking - SOB with lying down  ROS: Constitutional: no weight gain/no weight loss, no fatigue, no subjective hyperthermia, no subjective hypothermia Eyes: no blurry vision, no xerophthalmia ENT: no sore throat, + see HPI Cardiovascular: no CP/no SOB/no palpitations/no leg swelling Respiratory: no cough/no SOB/no wheezing Gastrointestinal: no N/no V/no D/no C/no acid reflux Musculoskeletal: no muscle aches/no joint aches Skin: no rashes, no hair loss Neurological: no tremors/no numbness/no tingling/no dizziness  I reviewed pt's medications, allergies, PMH, social hx, family hx, and changes were documented in the history of present  illness. Otherwise, unchanged from my initial visit note.   Past Medical History:  Diagnosis Date  . Diabetes mellitus without complication (HCC)   . Hyperlipidemia   . Thyroid disease    No past surgical history on file. History   Social History  . Marital Status: Married    Spouse Name: N/A    Number of Children: 2   Occupational History  . Sales assistant   Social History Main Topics  . Smoking status: Never Smoker   . Smokeless tobacco: Not on file  . Alcohol Use: Yes  . Drug Use: No   Current Outpatient Medications  Medication Sig Dispense Refill  . albuterol (PROVENTIL HFA;VENTOLIN HFA) 108 (90 Base) MCG/ACT inhaler  Inhale 2 puffs into the lungs every 6 (six) hours as needed for wheezing or shortness of breath. 1 Inhaler 0  . atorvastatin (LIPITOR) 20 MG tablet TAKE 1 TABLET BY MOUTH EVERY DAY 90 tablet 0  . atorvastatin (LIPITOR) 20 MG tablet TAKE 1 TABLET(20 MG) BY MOUTH DAILY 90 tablet 0  . BD PEN NEEDLE NANO U/F 32G X 4 MM MISC USE ONCE DAILY 100 each 0  . canagliflozin (INVOKANA) 100 MG TABS tablet TAKE 1 TABLET BY MOUTH DAILY BEFORE BREAKFAST. PATIENT NEEDS APPOINTMENT 30 tablet 2  . chlorhexidine (PERIDEX) 0.12 % solution   2  . desonide (DESOWEN) 0.05 % cream Apply topically 2 (two) times daily. 30 g 0  . Dulaglutide (TRULICITY) 1.5 MG/0.5ML SOPN Inject 1.5 mg under skin weekly 4 pen 11  . fluticasone (FLONASE) 50 MCG/ACT nasal spray Place 2 sprays into both nostrils daily. 16 g 0  . glipiZIDE (GLUCOTROL) 5 MG tablet TAKE 1 TABLET(5 MG) BY MOUTH before a large meal 30 tablet 5  . glipiZIDE (GLUCOTROL) 5 MG tablet TAKE 1 TABLET(5 MG) BY MOUTH TWICE DAILY BEFORE A MEAL 180 tablet 0  . glucose blood (ONETOUCH VERIO) test strip Use to test blood sugar 2 times daily as instructed 200 each 0  . LEVEMIR FLEXTOUCH 100 UNIT/ML Pen INJECT 30 UNITS UNDER THE SKIN AT BEDTIME 15 mL 0  . levothyroxine (SYNTHROID, LEVOTHROID) 50 MCG tablet TAKE 1 TABLET(50 MCG) BY MOUTH DAILY BEFORE BREAKFAST 90 tablet 0  . loratadine (CLARITIN) 10 MG tablet Take 1 tablet (10 mg total) by mouth daily. 60 tablet 0  . metFORMIN (GLUCOPHAGE) 1000 MG tablet TAKE 1 TABLET(1000 MG) BY MOUTH TWICE DAILY WITH A MEAL 180 tablet 0  . metFORMIN (GLUCOPHAGE) 1000 MG tablet TAKE 1 TABLET(1000 MG) BY MOUTH TWICE DAILY WITH A MEAL 180 tablet 0  . ONETOUCH DELICA LANCETS 33G MISC CHECK BLOOD SUGAR TWICE DAILY AS DIRECTED 100 each 3  . Skin Protectants, Misc. (EUCERIN) cream Apply topically as needed for dry skin. 454 g 0   No current facility-administered medications for this visit.    Allergies  Allergen Reactions  . Penicillins Hives and  Swelling   FH: - see HPI - HL in M, F - cancer in M  PE: BP 108/62   Pulse 81   Wt 161 lb 12.8 oz (73.4 kg)   SpO2 98%   BMI 24.60 kg/m   Wt Readings from Last 3 Encounters:  09/04/17 161 lb 12.8 oz (73.4 kg)  06/04/17 164 lb (74.4 kg)  02/19/17 170 lb (77.1 kg)   Constitutional: normal weight, in NAD Eyes: PERRLA, EOMI, no exophthalmos ENT: moist mucous membranes, no thyromegaly, no cervical lymphadenopathy Cardiovascular: RRR, No MRG Respiratory: CTA B Gastrointestinal: abdomen soft, NT, ND, BS+  Musculoskeletal: no deformities, strength intact in all 4 Skin: moist, warm, no rashes Neurological: no tremor with outstretched hands, DTR normal in all 4  ASSESSMENT: 1. DM2, insulin-dependent, uncontrolled, without long term complications  2. Hypothyroidism  3. HL  PLAN:  1. Patient with previously uncontrolled diabetes, now with much better control after adding GLP-1 receptor agonist.  We were able to stop his glipizide at last visit, but I advised him to only use it with a larger dinner. -At this visit, sugars are a little higher as he was on te lower Trulicity dose >> will increase this, but will also decrease Levemir as he may have lows in the 60s-70s before meals. - advised him to: Patient Instructions  Please continue: - Metformin 1000 mg 2x a day with meal - Invokana 100 mg daily in am  Please increase: - Trulicity 1.5 mg weekly.  Please decrease: - Levemir 25 units at bedtime  Coninue 1 tablet of glipizide before a large dinner  Please continue Levothyroxine 50 mcg daily.  Take the thyroid hormone every day, with water, at least 30 minutes before breakfast, separated by at least 4 hours from: - acid reflux medications - calcium - iron - multivitamins  Please come back for a follow-up appointment in 4 months  - today, HbA1c is 6.4%  - continue checking sugars at different times of the day - check 1x a day, rotating checks - advised for yearly eye  exams >> he is UTD - he got PNA and flu shot at last visit, will give TDaP today - Return to clinic in 4 mo with sugar log   2. Hypothyroidism - latest thyroid labs reviewed with pt >> normal at last visit - he continues on LT4 50 mcg daily - pt feels good on this dose. - we discussed about taking the thyroid hormone every day, with water, >30 minutes before breakfast, separated by >4 hours from acid reflux medications, calcium, iron, multivitamins. Pt. is taking it correctly  3. HL - Reviewed latest lipid panel: LDL much improved, with still high triglycerides and low HDL - Continues atorvastatin and occasional fish oil.  No side effects.  Carlus Pavlovristina Orra Nolde, MD PhD [facility] Endocrinology

## 2017-09-04 NOTE — Addendum Note (Signed)
Addended by: Yolande JollyLAWSON, Pasty Manninen on: 09/04/2017 09:27 AM   Modules accepted: Orders

## 2017-09-04 NOTE — Addendum Note (Signed)
Addended by: Darene LamerHOMPSON, Alondra Vandeven T on: 09/04/2017 08:22 AM   Modules accepted: Orders

## 2017-09-04 NOTE — Patient Instructions (Addendum)
Please continue: - Metformin 1000 mg 2x a day with meal - Invokana 100 mg daily in am  Please increase: - Trulicity 1.5 mg weekly.  Please decrease: - Levemir 25 units at bedtime  Coninue 1 tablet of glipizide before a large dinner  Please continue Levothyroxine 50 mcg daily.  Take the thyroid hormone every day, with water, at least 30 minutes before breakfast, separated by at least 4 hours from: - acid reflux medications - calcium - iron - multivitamins  Please come back for a follow-up appointment in 4 months.

## 2017-10-08 ENCOUNTER — Other Ambulatory Visit: Payer: Self-pay | Admitting: Internal Medicine

## 2017-10-08 NOTE — Telephone Encounter (Signed)
Is this okay to refill? 

## 2017-10-12 ENCOUNTER — Other Ambulatory Visit: Payer: Self-pay | Admitting: Internal Medicine

## 2017-11-23 ENCOUNTER — Other Ambulatory Visit: Payer: Self-pay | Admitting: Internal Medicine

## 2017-12-20 ENCOUNTER — Other Ambulatory Visit: Payer: Self-pay | Admitting: Internal Medicine

## 2017-12-21 ENCOUNTER — Other Ambulatory Visit: Payer: Self-pay | Admitting: Internal Medicine

## 2018-01-02 ENCOUNTER — Encounter: Payer: Self-pay | Admitting: Internal Medicine

## 2018-01-02 ENCOUNTER — Ambulatory Visit: Payer: BLUE CROSS/BLUE SHIELD | Admitting: Internal Medicine

## 2018-01-02 VITALS — BP 116/74 | HR 83 | Ht 68.0 in | Wt 161.2 lb

## 2018-01-02 DIAGNOSIS — E785 Hyperlipidemia, unspecified: Secondary | ICD-10-CM | POA: Diagnosis not present

## 2018-01-02 DIAGNOSIS — IMO0001 Reserved for inherently not codable concepts without codable children: Secondary | ICD-10-CM

## 2018-01-02 DIAGNOSIS — E1165 Type 2 diabetes mellitus with hyperglycemia: Secondary | ICD-10-CM

## 2018-01-02 DIAGNOSIS — E039 Hypothyroidism, unspecified: Secondary | ICD-10-CM | POA: Diagnosis not present

## 2018-01-02 LAB — POCT GLYCOSYLATED HEMOGLOBIN (HGB A1C): HEMOGLOBIN A1C: 6.9

## 2018-01-02 MED ORDER — CANAGLIFLOZIN 100 MG PO TABS: ORAL_TABLET | ORAL | 3 refills | Status: DC

## 2018-01-02 MED ORDER — LEVOTHYROXINE SODIUM 50 MCG PO TABS: ORAL_TABLET | ORAL | 3 refills | Status: DC

## 2018-01-02 MED ORDER — METFORMIN HCL 1000 MG PO TABS: ORAL_TABLET | ORAL | 3 refills | Status: DC

## 2018-01-02 MED ORDER — INSULIN DETEMIR 100 UNIT/ML FLEXPEN: PEN_INJECTOR | SUBCUTANEOUS | 5 refills | Status: DC

## 2018-01-02 MED ORDER — DULAGLUTIDE 1.5 MG/0.5ML ~~LOC~~ SOAJ: SUBCUTANEOUS | 3 refills | Status: DC

## 2018-01-02 NOTE — Patient Instructions (Addendum)
Please continue: - Metformin 1000 mg 2x a day with meal - Invokana 100 mg daily in am - Trulicity 1.5 mg weekly - Levemir 25 units at bedtime - Glipizide 5 mg  before large dinner (try to take this before lunch)  Please call and schedule an eye appt with Dr. Randon GoldsmithLyles: [facility]:  Dr. Jeni SallesGraham W. Lyles MD ?  Address: [address], [place], [place] [address]  [phone]  Please continue Levothyroxine 50 mcg daily.  Take the thyroid hormone every day, with water, at least 30 minutes before breakfast, separated by at least 4 hours from: - acid reflux medications - calcium - iron - multivitamins  Please come back for a follow-up appointment in 4 months.

## 2018-01-02 NOTE — Progress Notes (Signed)
Patient ID: Raymond Fitzgerald, male   DOB: Oct 10, 1970, 47 y.o.   MRN: [id number]  HPI: Raymond Fitzgerald is a 47 y.o.-year-old male, returning for f/u for DM2, dx 2011, insulin-dependent since 2014, uncontrolled, without long term complications and also hypothyroidism. He moved from [place]. Maisie Fus in 07/2014.  PCP: Dr. Pershing Proud. Last visit 4 months ago.  DM2: Last hemoglobin A1c was: Lab Results  Component Value Date   HGBA1C 6.4 09/04/2017   HGBA1C 5.9 06/04/2017   HGBA1C 7.7 12/19/2016  10/2014: HbA1c 9.5% 05/2014: HbA1c 8.5%  He is on: - Metformin 1000 mg 2x a day with meal - Invokana 100 mg daily in am - Levemir 30 >> 25 units at bedtime - Trulicity 1.5 mg weekly Uses Glipizide 5 mg before a larger dinner - not since last visit He was on Bydureon 2 mg weekly >> nodules under skin at inj sites >> Tanzeum >> Victoza 1.8 mg daily >> not covered  Pt checks his sugars twice a day. Reviewed log: - am:  110-140 >> 135-145 >> 135 >> 128 >> 110-130 - 2h after b'fast:  135, 185 >> 79, 106-146 >> 147 >> n/c - before lunch: 103-122 >> 77-108 >> 86, 92 >> n/c - 2h after lunch:  102-170 (dessert) >> 160, 175 >> 130-180 - before dinner: n/c >> 95-126, 140 >> 125 >> 109 >> 110-150 - 2h after dinner: 185-195 (icecream) >> n/c - bedtime: n/c >> 150-160 - nighttime: n/c Lowest sugar was 64 >> 110; he has hypoglycemia awareness in the 70s. Highest sugar was 175 >> 180 (sweets).  Meter: GE (not covered by [facility]).  Pt's meals are: - Breakfast: tea (unsweet) + cookies >> 12 grain bread + butter + tea  - Lunch: tortilla + vegetables, chicken/fish/seafood - Dinner: same - Snacks: 2 : protein bars mid-pm; post dinner: fruit, bar Occasionally cinnamon water >> not lately  -No CKD, last BUN/creatinine:  Lab Results  Component Value Date   BUN 14 06/04/2017   CREATININE 0.90 06/04/2017  Not on an ACE inhibitor/ARB.  No MAU: Lab Results  Component Value Date   MICRALBCREAT 1.0 06/04/2017     -+ HL; last set of lipids: Lab Results  Component Value Date   CHOL 117 06/04/2017   HDL 25.00 (L) 06/04/2017   LDLCALC 59 06/04/2017   LDLDIRECT 95.0 08/22/2016   TRIG 164.0 (H) 06/04/2017   CHOLHDL 5 06/04/2017  On Lipitor 20,  occasional fish oil - last eye exam was in 05/2016: No DR - He denies numbness and tingling in his feet.  + Occasional muscle cramps.  He has hypothyroidism.   Pt is on levothyroxine 50 mcg daily, taken: - in am - fasting - at least 30 min from b'fast - no Ca, Fe, PPIs - + MVI at night - not on Biotin  Reviewed latest TSH level: Lab Results  Component Value Date   TSH 3.23 06/04/2017   Pt denies: - feeling nodules in neck - hoarseness - dysphagia - choking - SOB with lying down  ROS: Constitutional: no weight gain/no weight loss, no fatigue, no subjective hyperthermia, no subjective hypothermia Eyes: no blurry vision, no xerophthalmia ENT: no sore throat, + see HPI Cardiovascular: no CP/no SOB/no palpitations/no leg swelling Respiratory: no cough/no SOB/no wheezing Gastrointestinal: no N/no V/no D/no C/no acid reflux Musculoskeletal: no muscle aches/no joint aches, + occasional muscle cramps Skin: no rashes, no hair loss Neurological: no tremors/no numbness/no tingling/no dizziness  I reviewed pt's medications, allergies, PMH, social hx,  family hx, and changes were documented in the history of present illness. Otherwise, unchanged from my initial visit note.   Past Medical History:  Diagnosis Date  . Diabetes mellitus without complication (HCC)   . Hyperlipidemia   . Thyroid disease    No past surgical history on file. History   Social History  . Marital Status: Married    Spouse Name: N/A    Number of Children: 2   Occupational History  . Sales assistant   Social History Main Topics  . Smoking status: Never Smoker   . Smokeless tobacco: Not on file  . Alcohol Use: Yes  . Drug Use: No   Current Outpatient Medications   Medication Sig Dispense Refill  . albuterol (PROVENTIL HFA;VENTOLIN HFA) 108 (90 Base) MCG/ACT inhaler Inhale 2 puffs into the lungs every 6 (six) hours as needed for wheezing or shortness of breath. (Patient not taking: Reported on 09/04/2017) 1 Inhaler 0  . atorvastatin (LIPITOR) 20 MG tablet TAKE 1 TABLET(20 MG) BY MOUTH DAILY 90 tablet 3  . BD PEN NEEDLE NANO U/F 32G X 4 MM MISC USE ONCE DAILY 100 each 0  . canagliflozin (INVOKANA) 100 MG TABS tablet TAKE 1 TABLET BY MOUTH DAILY BEFORE BREAKFAST. 90 tablet 3  . chlorhexidine (PERIDEX) 0.12 % solution   2  . desonide (DESOWEN) 0.05 % cream Apply topically 2 (two) times daily. 30 g 0  . Dulaglutide (TRULICITY) 1.5 MG/0.5ML SOPN Inject 1.5 mg under skin weekly 4 pen 11  . fluticasone (FLONASE) 50 MCG/ACT nasal spray Place 2 sprays into both nostrils daily. 16 g 0  . glipiZIDE (GLUCOTROL) 5 MG tablet TAKE 1 TABLET(5 MG) BY MOUTH before a large meal 30 tablet 5  . glucose blood (ONETOUCH VERIO) test strip Use to test blood sugar 2 times daily as instructed 200 each 0  . Insulin Detemir (LEVEMIR FLEXTOUCH) 100 UNIT/ML Pen INJECT 25 UNITS UNDER THE SKIN AT BEDTIME 15 mL 5  . levothyroxine (SYNTHROID, LEVOTHROID) 50 MCG tablet TAKE 1 TABLET(50 MCG) BY MOUTH DAILY BEFORE BREAKFAST 90 tablet 0  . loratadine (CLARITIN) 10 MG tablet Take 1 tablet (10 mg total) by mouth daily. 60 tablet 0  . metFORMIN (GLUCOPHAGE) 1000 MG tablet TAKE 1 TABLET(1000 MG) BY MOUTH TWICE DAILY WITH A MEAL 180 tablet 0  . ONETOUCH DELICA LANCETS 33G MISC CHECK BLOOD SUGAR TWICE DAILY AS DIRECTED 100 each 3  . Skin Protectants, Misc. (EUCERIN) cream Apply topically as needed for dry skin. 454 g 0   No current facility-administered medications for this visit.    Allergies  Allergen Reactions  . Penicillins Hives and Swelling   FH: - see HPI - HL in M, F - cancer in M  PE: There were no vitals taken for this visit.  Wt Readings from Last 3 Encounters:  09/04/17 161  lb 12.8 oz (73.4 kg)  06/04/17 164 lb (74.4 kg)  02/19/17 170 lb (77.1 kg)   Constitutional: overweight, in NAD Eyes: PERRLA, EOMI, no exophthalmos ENT: moist mucous membranes, no thyromegaly, no cervical lymphadenopathy Cardiovascular: RRR, No MRG Respiratory: CTA B Gastrointestinal: abdomen soft, NT, ND, BS+ Musculoskeletal: no deformities, strength intact in all 4 Skin: moist, warm, no rashes Neurological: no tremor with outstretched hands, DTR normal in all 4  ASSESSMENT: 1. DM2, insulin-dependent, uncontrolled, without long term complications  2. Hypothyroidism  3. HL  PLAN:  1. Patient with previously uncontrolled diabetes, now with much better control after adding GLP-1 receptor agonist.  At  last visit, we were able to decrease his Levemir dose and increase his Trulicity dose as he was having low blood sugars 60s-70s before meals.  I also advised him to use glipizide 5 mg only before large meals. -Since last visit, he did not have to get the glipizide before dinner.  He did not try to take it before lunch. -Reviewing his sugars at home, his sugars are at goal in the morning but they increase and they are slightly high after lunch and before dinner.  We discussed about trying to take glipizide before lunch to see if the before dinner sugars improved.  However, we also discussed about how to improve his lunch so that sugars afterwards are not high -He is interested in coming off insulin and we discussed about how to reduce his insulin resistance so he can come off.  For now, though, we cannot stop it. - advised him to: Patient Instructions  Please continue: - Metformin 1000 mg 2x a day with meal - Invokana 100 mg daily in am - Trulicity 1.5 mg weekly - Levemir 25 units at bedtime - Glipizide 5 mg  before large dinner (try to take this before lunch)  Please call and schedule an eye appt with Dr. Randon Goldsmith: [facility]:  Dr. Jeni Salles MD ?  Address:  [address] [place], [place], [place] [address]  [phone]  Please continue Levothyroxine 50 mcg daily.  Take the thyroid hormone every day, with water, at least 30 minutes before breakfast, separated by at least 4 hours from: - acid reflux medications - calcium - iron - multivitamins  Please come back for a follow-up appointment in 4 months.  - today, HbA1c is 6.9% (higher) - continue checking sugars at different times of the day - check 1x a day, rotating checks - advised for yearly eye exams >> he is UTD - Return to clinic in 3 mo with sugar log   2. Hypothyroidism - latest thyroid labs reviewed with pt >> normal 05/2017 - he continues on LT4 50 mcg daily - pt feels good on this dose. - we discussed about taking the thyroid hormone every day, with water, >30 minutes before breakfast, separated by >4 hours from acid reflux medications, calcium, iron, multivitamins. Pt. is taking it correctly. - We will repeat TFTs at next visit  3. HL -Reviewed latest lipid panel from 05/2017: LDL much improved, with still high triglycerides and low HDL -Continues atorvastatin and fish oil.  No side effects.  Carlus Pavlov, MD PhD [facility]

## 2018-01-06 ENCOUNTER — Other Ambulatory Visit: Payer: Self-pay | Admitting: Internal Medicine

## 2018-01-20 ENCOUNTER — Other Ambulatory Visit: Payer: Self-pay | Admitting: Internal Medicine

## 2018-01-26 ENCOUNTER — Encounter (HOSPITAL_COMMUNITY): Payer: Self-pay | Admitting: Family Medicine

## 2018-01-26 ENCOUNTER — Encounter (HOSPITAL_COMMUNITY): Payer: Self-pay | Admitting: Emergency Medicine

## 2018-01-26 ENCOUNTER — Ambulatory Visit (HOSPITAL_COMMUNITY)
Admission: EM | Admit: 2018-01-26 | Discharge: 2018-01-26 | Disposition: A | Discharge status: Home / Self Care | Payer: BLUE CROSS/BLUE SHIELD | Source: Home / Self Care

## 2018-01-26 ENCOUNTER — Other Ambulatory Visit: Payer: Self-pay

## 2018-01-26 ENCOUNTER — Inpatient Hospital Stay (HOSPITAL_COMMUNITY)
Admission: EM | Admit: 2018-01-26 | Discharge: 2018-01-29 | DRG: 854 | Disposition: A | Discharge status: Home / Self Care | Payer: BLUE CROSS/BLUE SHIELD | Attending: Family Medicine | Admitting: Family Medicine

## 2018-01-26 DIAGNOSIS — E1165 Type 2 diabetes mellitus with hyperglycemia: Secondary | ICD-10-CM | POA: Diagnosis present

## 2018-01-26 DIAGNOSIS — IMO0001 Reserved for inherently not codable concepts without codable children: Secondary | ICD-10-CM | POA: Diagnosis present

## 2018-01-26 DIAGNOSIS — A419 Sepsis, unspecified organism: Principal | ICD-10-CM

## 2018-01-26 DIAGNOSIS — Z880 Allergy status to penicillin: Secondary | ICD-10-CM

## 2018-01-26 DIAGNOSIS — E785 Hyperlipidemia, unspecified: Secondary | ICD-10-CM | POA: Diagnosis present

## 2018-01-26 DIAGNOSIS — Z9049 Acquired absence of other specified parts of digestive tract: Secondary | ICD-10-CM | POA: Diagnosis present

## 2018-01-26 DIAGNOSIS — E860 Dehydration: Secondary | ICD-10-CM | POA: Diagnosis present

## 2018-01-26 DIAGNOSIS — K82A1 Gangrene of gallbladder in cholecystitis: Secondary | ICD-10-CM | POA: Diagnosis present

## 2018-01-26 DIAGNOSIS — Z794 Long term (current) use of insulin: Secondary | ICD-10-CM

## 2018-01-26 DIAGNOSIS — Z7984 Long term (current) use of oral hypoglycemic drugs: Secondary | ICD-10-CM

## 2018-01-26 DIAGNOSIS — R112 Nausea with vomiting, unspecified: Secondary | ICD-10-CM

## 2018-01-26 DIAGNOSIS — I10 Essential (primary) hypertension: Secondary | ICD-10-CM | POA: Diagnosis present

## 2018-01-26 DIAGNOSIS — K529 Noninfective gastroenteritis and colitis, unspecified: Secondary | ICD-10-CM

## 2018-01-26 DIAGNOSIS — K810 Acute cholecystitis: Secondary | ICD-10-CM

## 2018-01-26 DIAGNOSIS — E039 Hypothyroidism, unspecified: Secondary | ICD-10-CM | POA: Diagnosis present

## 2018-01-26 DIAGNOSIS — Z79899 Other long term (current) drug therapy: Secondary | ICD-10-CM

## 2018-01-26 DIAGNOSIS — K8000 Calculus of gallbladder with acute cholecystitis without obstruction: Secondary | ICD-10-CM | POA: Diagnosis present

## 2018-01-26 DIAGNOSIS — K660 Peritoneal adhesions (postprocedural) (postinfection): Secondary | ICD-10-CM | POA: Diagnosis present

## 2018-01-26 LAB — URINALYSIS, ROUTINE W REFLEX MICROSCOPIC
BACTERIA UA: NONE SEEN
BILIRUBIN URINE: NEGATIVE
Glucose, UA: >=500 mg/dL — AB
HGB URINE DIPSTICK: NEGATIVE
KETONES UR: 80 mg/dL — AB
LEUKOCYTES UA: NEGATIVE
NITRITE: NEGATIVE
PROTEIN: NEGATIVE mg/dL
SPECIFIC GRAVITY, URINE: 1.037 — AB (ref 1.005–1.030)
Squamous Epithelial / LPF: NONE SEEN
pH: 5 (ref 5.0–8.0)

## 2018-01-26 LAB — COMPREHENSIVE METABOLIC PANEL
ALBUMIN: 4.3 g/dL (ref 3.5–5.0)
ALK PHOS: 70 U/L (ref 38–126)
ALT: 21 U/L (ref 17–63)
ANION GAP: 17 — AB (ref 5–15)
AST: 15 U/L (ref 15–41)
BILIRUBIN TOTAL: 2 mg/dL — AB (ref 0.3–1.2)
BUN: 14 mg/dL (ref 6–20)
CALCIUM: 9.3 mg/dL (ref 8.9–10.3)
CO2: 18 mmol/L — ABNORMAL LOW (ref 22–32)
Chloride: 102 mmol/L (ref 101–111)
Creatinine, Ser: 0.8 mg/dL (ref 0.61–1.24)
GFR calc non Af Amer: >60 mL/min (ref >60)
Glucose, Bld: 168 mg/dL — ABNORMAL HIGH (ref 65–99)
POTASSIUM: 4.4 mmol/L (ref 3.5–5.1)
Sodium: 137 mmol/L (ref 135–145)
TOTAL PROTEIN: 8.2 g/dL — AB (ref 6.5–8.1)

## 2018-01-26 LAB — CBC
HEMATOCRIT: 48.5 % (ref 39.0–52.0)
HEMOGLOBIN: 16.9 g/dL (ref 13.0–17.0)
MCH: 28.2 pg (ref 26.0–34.0)
MCHC: 34.8 g/dL (ref 30.0–36.0)
MCV: 81 fL (ref 78.0–100.0)
Platelets: 209 10*3/uL (ref 150–400)
RBC: 5.99 MIL/uL — AB (ref 4.22–5.81)
RDW: 13.3 % (ref 11.5–15.5)
WBC: 25.1 10*3/uL — AB (ref 4.0–10.5)

## 2018-01-26 LAB — LIPASE, BLOOD: Lipase: 23 U/L (ref 11–51)

## 2018-01-26 MED ORDER — SODIUM CHLORIDE 0.9 % IV BOLUS
2000.0000 mL | Freq: Once | INTRAVENOUS | Status: AC
  Administered 2018-01-26: 2000 mL via INTRAVENOUS

## 2018-01-26 MED ORDER — ONDANSETRON 4 MG PO TBDP: 4.0000 mg | ORAL_TABLET | Freq: Once | ORAL | Status: DC

## 2018-01-26 NOTE — ED Triage Notes (Signed)
Pt sent from urgent care for possible dehydration. Pt reports that he began having nausea/vomiting/abdominal pain last night at 1800. 1 episode of vomiting today. Hx diabetes, states he has not been taking his medications since feeling bad.

## 2018-01-26 NOTE — ED Notes (Signed)
ED Provider at bedside. 

## 2018-01-26 NOTE — ED Provider Notes (Signed)
[facility]   [id number] 01/26/18 Arrival Time: 1345   SUBJECTIVE:  Raymond Fitzgerald is a 47 y.o. male who presents to the urgent care with complaint of nausea and vomiting for 18 hours.  He is having considerable epigastric and right upper quadrant pain.  Unable to eat anything or drink anything.  No prior episodes.  No one else sick at home.   Past Medical History:  Diagnosis Date  . Diabetes mellitus without complication (HCC)   . Hyperlipidemia   . Thyroid disease    History reviewed. No pertinent family history. Social History   Socioeconomic History  . Marital status: Married    Spouse name: Not on file  . Number of children: Not on file  . Years of education: Not on file  . Highest education level: Not on file  Occupational History  . Not on file  Social Needs  . Financial resource strain: Not on file  . Food insecurity:    Worry: Not on file    Inability: Not on file  . Transportation needs:    Medical: Not on file    Non-medical: Not on file  Tobacco Use  . Smoking status: Never Smoker  . Smokeless tobacco: Never Used  Substance and Sexual Activity  . Alcohol use: Yes  . Drug use: No  . Sexual activity: Not on file  Lifestyle  . Physical activity:    Days per week: Not on file    Minutes per session: Not on file  . Stress: Not on file  Relationships  . Social connections:    Talks on phone: Not on file    Gets together: Not on file    Attends religious service: Not on file    Active member of club or organization: Not on file    Attends meetings of clubs or organizations: Not on file    Relationship status: Not on file  . Intimate partner violence:    Fear of current or ex partner: Not on file    Emotionally abused: Not on file    Physically abused: Not on file    Forced sexual activity: Not on file  Other Topics Concern  . Not on file  Social History Narrative  . Not on file   No outpatient medications have been marked as taking  for the 01/26/18 encounter [facility](Hospital Encounter).   Allergies  Allergen Reactions  . Penicillins Hives and Swelling      ROS: As per HPI, remainder of ROS negative.   OBJECTIVE:   Vitals:   01/26/18 1435  BP: (!) 146/92  Pulse: (!) 107  Resp: 18  Temp: 99.3 F (37.4 C)  SpO2: 100%     General appearance: alert; no distress Eyes: PERRL; EOMI; conjunctiva normal HENT: normocephalic; atraumatic; TMs normal, canal normal, external ears normal without trauma; nasal mucosa normal; oral mucosa normal Neck: supple Lungs: clear to auscultation bilaterally Heart: rapid rate and regular rhythm Abdomen: soft, very tender epigastrium; bowel sounds normal; no masses or organomegaly; some guarding or rebound tenderness noted. Back: no CVA tenderness Extremities: no cyanosis or edema; symmetrical with no gross deformities Skin: warm and dry Neurologic: normal gait; grossly normal Psychological: alert and cooperative; normal mood and affect      Labs:  Results for orders placed or performed in visit on 01/02/18  POCT glycosylated hemoglobin (Hb A1C)  Result Value Ref Range   Hemoglobin A1C 6.9     Labs Reviewed - No data to display  No  results found.     ASSESSMENT & PLAN:  1. Dehydration   2. Nausea and vomiting, intractability of vomiting not specified, unspecified vomiting type     No orders of the defined types were placed in this encounter.  You need to be evaluated in the emergency room because of the dehydration and location of pain Reviewed expectations re: course of current medical issues. Questions answered. Outlined signs and symptoms indicating need for more acute intervention. Patient verbalized understanding. After Visit Summary given.      Elvina Sidle, MD 01/26/18 1454

## 2018-01-26 NOTE — ED Triage Notes (Addendum)
Pt with a hx of diabetes here for N.V since 6 pm yesterday. Started after eating 2 hours prior.

## 2018-01-26 NOTE — Discharge Instructions (Addendum)
You need to be evaluated in the emergency room because of the dehydration and location of pain

## 2018-01-26 NOTE — ED Provider Notes (Signed)
MSE was initiated and I personally evaluated the patient and placed orders (if any) at  4:10 PM on January 26, 2018.  The patient appears stable so that the remainder of the MSE may be completed by another provider.  Patient placed in Quick Look pathway, seen and evaluated   Chief Complaint: Nausea vomiting and diarrhea  HPI:   Patient with nausea vomiting and diarrhea for the past 24 hours.  It began suddenly after he ate at a buffet last night.  His family ate there as well and no one else got sick.  He does think he might have food poisoning.  He has had at least 10-12 episodes of vomiting.  He was [facility] earlier [facility] and sent in for further evaluation and concern for dehydration here.  No chest pain or shortness of breath.  He has a history of diabetes type 2 and is not take his insulin because he has not eaten in the past 24 hours.  ROS: Nausea vomiting and diarrhea (one)  Physical Exam:   Gen: No distress  Neuro: Awake and Alert  Skin: Warm    Focused Exam: Tender to palpation in the epigastrium   Initiation of care has begun. The patient has been counseled on the process, plan, and necessity for staying for the completion/evaluation, and the remainder of the medical screening examination    Arthor CaptainHarris, Shashank Kwasnik, PA-C 01/26/18 1616    Melene PlanFloyd, Dan, DO 01/26/18 2057

## 2018-01-27 ENCOUNTER — Emergency Department (HOSPITAL_COMMUNITY): Payer: BLUE CROSS/BLUE SHIELD

## 2018-01-27 ENCOUNTER — Inpatient Hospital Stay (HOSPITAL_COMMUNITY): Payer: BLUE CROSS/BLUE SHIELD

## 2018-01-27 ENCOUNTER — Encounter (HOSPITAL_COMMUNITY): Payer: Self-pay | Admitting: Internal Medicine

## 2018-01-27 DIAGNOSIS — E785 Hyperlipidemia, unspecified: Secondary | ICD-10-CM | POA: Diagnosis present

## 2018-01-27 DIAGNOSIS — Z9049 Acquired absence of other specified parts of digestive tract: Secondary | ICD-10-CM | POA: Diagnosis present

## 2018-01-27 DIAGNOSIS — K82A1 Gangrene of gallbladder in cholecystitis: Secondary | ICD-10-CM | POA: Diagnosis present

## 2018-01-27 DIAGNOSIS — K660 Peritoneal adhesions (postprocedural) (postinfection): Secondary | ICD-10-CM | POA: Diagnosis present

## 2018-01-27 DIAGNOSIS — E860 Dehydration: Secondary | ICD-10-CM | POA: Diagnosis present

## 2018-01-27 DIAGNOSIS — A09 Infectious gastroenteritis and colitis, unspecified: Secondary | ICD-10-CM | POA: Insufficient documentation

## 2018-01-27 DIAGNOSIS — E1165 Type 2 diabetes mellitus with hyperglycemia: Secondary | ICD-10-CM

## 2018-01-27 DIAGNOSIS — E039 Hypothyroidism, unspecified: Secondary | ICD-10-CM

## 2018-01-27 DIAGNOSIS — Z79899 Other long term (current) drug therapy: Secondary | ICD-10-CM | POA: Diagnosis not present

## 2018-01-27 DIAGNOSIS — Z880 Allergy status to penicillin: Secondary | ICD-10-CM | POA: Diagnosis not present

## 2018-01-27 DIAGNOSIS — Z7984 Long term (current) use of oral hypoglycemic drugs: Secondary | ICD-10-CM | POA: Diagnosis not present

## 2018-01-27 DIAGNOSIS — K8000 Calculus of gallbladder with acute cholecystitis without obstruction: Secondary | ICD-10-CM | POA: Diagnosis present

## 2018-01-27 DIAGNOSIS — A419 Sepsis, unspecified organism: Secondary | ICD-10-CM | POA: Diagnosis present

## 2018-01-27 DIAGNOSIS — I10 Essential (primary) hypertension: Secondary | ICD-10-CM | POA: Diagnosis present

## 2018-01-27 DIAGNOSIS — Z794 Long term (current) use of insulin: Secondary | ICD-10-CM | POA: Diagnosis not present

## 2018-01-27 DIAGNOSIS — K810 Acute cholecystitis: Secondary | ICD-10-CM

## 2018-01-27 LAB — COMPREHENSIVE METABOLIC PANEL
ALT: 16 U/L — AB (ref 17–63)
AST: 13 U/L — AB (ref 15–41)
Albumin: 3.3 g/dL — ABNORMAL LOW (ref 3.5–5.0)
Alkaline Phosphatase: 48 U/L (ref 38–126)
Anion gap: 12 (ref 5–15)
BUN: 12 mg/dL (ref 6–20)
CHLORIDE: 103 mmol/L (ref 101–111)
CO2: 18 mmol/L — ABNORMAL LOW (ref 22–32)
Calcium: 7.8 mg/dL — ABNORMAL LOW (ref 8.9–10.3)
Creatinine, Ser: 0.75 mg/dL (ref 0.61–1.24)
Glucose, Bld: 144 mg/dL — ABNORMAL HIGH (ref 65–99)
POTASSIUM: 3.8 mmol/L (ref 3.5–5.1)
SODIUM: 133 mmol/L — AB (ref 135–145)
Total Bilirubin: 1.9 mg/dL — ABNORMAL HIGH (ref 0.3–1.2)
Total Protein: 6.1 g/dL — ABNORMAL LOW (ref 6.5–8.1)

## 2018-01-27 LAB — GLUCOSE, CAPILLARY
GLUCOSE-CAPILLARY: 104 mg/dL — AB (ref 65–99)
GLUCOSE-CAPILLARY: 151 mg/dL — AB (ref 65–99)
Glucose-Capillary: 104 mg/dL — ABNORMAL HIGH (ref 65–99)

## 2018-01-27 LAB — I-STAT CG4 LACTIC ACID, ED: LACTIC ACID, VENOUS: 1.18 mmol/L (ref 0.5–1.9)

## 2018-01-27 LAB — TSH: TSH: 4.622 u[IU]/mL — ABNORMAL HIGH (ref 0.350–4.500)

## 2018-01-27 MED ORDER — CIPROFLOXACIN IN D5W 400 MG/200ML IV SOLN
400.0000 mg | Freq: Two times a day (BID) | INTRAVENOUS | Status: DC
  Administered 2018-01-27 – 2018-01-29 (×5): 400 mg via INTRAVENOUS
  Filled 2018-01-27 (×5): qty 200

## 2018-01-27 MED ORDER — MORPHINE SULFATE (PF) 4 MG/ML IV SOLN
2.0000 mg | Freq: Once | INTRAVENOUS | Status: AC
  Administered 2018-01-27: 2 mg via INTRAVENOUS
  Filled 2018-01-27: qty 1

## 2018-01-27 MED ORDER — SODIUM CHLORIDE 0.9 % IV BOLUS
1000.0000 mL | Freq: Once | INTRAVENOUS | Status: AC
  Administered 2018-01-27: 1000 mL via INTRAVENOUS

## 2018-01-27 MED ORDER — METRONIDAZOLE IN NACL 5-0.79 MG/ML-% IV SOLN
500.0000 mg | Freq: Once | INTRAVENOUS | Status: AC
  Administered 2018-01-27: 500 mg via INTRAVENOUS
  Filled 2018-01-27: qty 100

## 2018-01-27 MED ORDER — INSULIN ASPART 100 UNIT/ML ~~LOC~~ SOLN
0.0000 [IU] | Freq: Every day | SUBCUTANEOUS | Status: DC
  Administered 2018-01-28: 2 [IU] via SUBCUTANEOUS

## 2018-01-27 MED ORDER — TECHNETIUM TC 99M MEBROFENIN IV KIT
5.0000 | PACK | Freq: Once | INTRAVENOUS | Status: AC | PRN
  Administered 2018-01-27: 5 via INTRAVENOUS

## 2018-01-27 MED ORDER — ACETAMINOPHEN 325 MG PO TABS
650.0000 mg | ORAL_TABLET | Freq: Four times a day (QID) | ORAL | Status: DC | PRN
  Administered 2018-01-28: 650 mg via ORAL
  Filled 2018-01-27: qty 2

## 2018-01-27 MED ORDER — METRONIDAZOLE IN NACL 5-0.79 MG/ML-% IV SOLN
500.0000 mg | Freq: Three times a day (TID) | INTRAVENOUS | Status: DC
  Administered 2018-01-27 – 2018-01-29 (×6): 500 mg via INTRAVENOUS
  Filled 2018-01-27 (×6): qty 100

## 2018-01-27 MED ORDER — IOPAMIDOL (ISOVUE-300) INJECTION 61%
100.0000 mL | Freq: Once | INTRAVENOUS | Status: AC | PRN
  Administered 2018-01-27: 100 mL via INTRAVENOUS

## 2018-01-27 MED ORDER — CIPROFLOXACIN IN D5W 400 MG/200ML IV SOLN
400.0000 mg | Freq: Once | INTRAVENOUS | Status: AC
  Administered 2018-01-27: 400 mg via INTRAVENOUS
  Filled 2018-01-27: qty 200

## 2018-01-27 MED ORDER — IOPAMIDOL (ISOVUE-300) INJECTION 61%
INTRAVENOUS | Status: AC
  Filled 2018-01-27: qty 100

## 2018-01-27 MED ORDER — INSULIN DETEMIR 100 UNIT/ML ~~LOC~~ SOLN
25.0000 [IU] | Freq: Every day | SUBCUTANEOUS | Status: DC
  Administered 2018-01-27 – 2018-01-28 (×2): 25 [IU] via SUBCUTANEOUS
  Filled 2018-01-27 (×3): qty 0.25

## 2018-01-27 MED ORDER — FAMOTIDINE IN NACL 20-0.9 MG/50ML-% IV SOLN
20.0000 mg | INTRAVENOUS | Status: AC
  Administered 2018-01-27: 20 mg via INTRAVENOUS
  Filled 2018-01-27: qty 50

## 2018-01-27 MED ORDER — MORPHINE SULFATE (PF) 4 MG/ML IV SOLN
4.0000 mg | Freq: Once | INTRAVENOUS | Status: AC
  Administered 2018-01-27: 4 mg via INTRAVENOUS
  Filled 2018-01-27: qty 1

## 2018-01-27 MED ORDER — MORPHINE SULFATE (PF) 4 MG/ML IV SOLN
INTRAVENOUS | Status: AC
  Administered 2018-01-27: 3 mg via INTRAVENOUS
  Filled 2018-01-27: qty 1

## 2018-01-27 MED ORDER — LACTATED RINGERS IV SOLN
INTRAVENOUS | Status: DC
  Administered 2018-01-27 – 2018-01-28 (×6): via INTRAVENOUS

## 2018-01-27 MED ORDER — MORPHINE SULFATE (PF) 4 MG/ML IV SOLN
2.0000 mg | INTRAVENOUS | Status: DC | PRN
  Administered 2018-01-27 – 2018-01-29 (×6): 2 mg via INTRAVENOUS
  Filled 2018-01-27 (×6): qty 1

## 2018-01-27 MED ORDER — LEVOTHYROXINE SODIUM 50 MCG PO TABS
50.0000 ug | ORAL_TABLET | Freq: Every day | ORAL | Status: DC
  Administered 2018-01-28 – 2018-01-29 (×2): 50 ug via ORAL
  Filled 2018-01-27 (×2): qty 1

## 2018-01-27 MED ORDER — ATORVASTATIN CALCIUM 20 MG PO TABS
20.0000 mg | ORAL_TABLET | Freq: Every day | ORAL | Status: DC
  Administered 2018-01-27 – 2018-01-29 (×3): 20 mg via ORAL
  Filled 2018-01-27 (×3): qty 1

## 2018-01-27 MED ORDER — ONDANSETRON HCL 4 MG PO TABS: 4.0000 mg | ORAL_TABLET | Freq: Four times a day (QID) | ORAL | Status: DC | PRN

## 2018-01-27 MED ORDER — ONDANSETRON HCL 4 MG/2ML IJ SOLN
4.0000 mg | Freq: Once | INTRAMUSCULAR | Status: AC
  Administered 2018-01-27: 4 mg via INTRAVENOUS
  Filled 2018-01-27: qty 2

## 2018-01-27 MED ORDER — ACETAMINOPHEN 500 MG PO TABS
1000.0000 mg | ORAL_TABLET | Freq: Once | ORAL | Status: AC
Start: 2018-01-27 — End: 2018-01-27
  Administered 2018-01-27: 1000 mg via ORAL
  Filled 2018-01-27: qty 2

## 2018-01-27 MED ORDER — ONDANSETRON HCL 4 MG/2ML IJ SOLN
4.0000 mg | Freq: Four times a day (QID) | INTRAMUSCULAR | Status: DC | PRN
  Administered 2018-01-28 (×2): 4 mg via INTRAVENOUS
  Filled 2018-01-27 (×2): qty 2

## 2018-01-27 MED ORDER — INSULIN ASPART 100 UNIT/ML ~~LOC~~ SOLN
0.0000 [IU] | Freq: Three times a day (TID) | SUBCUTANEOUS | Status: DC
  Administered 2018-01-28 (×2): 2 [IU] via SUBCUTANEOUS
  Administered 2018-01-29: 5 [IU] via SUBCUTANEOUS
  Administered 2018-01-29 (×2): 3 [IU] via SUBCUTANEOUS

## 2018-01-27 MED ORDER — ACETAMINOPHEN 650 MG RE SUPP: 650.0000 mg | Freq: Four times a day (QID) | RECTAL | Status: DC | PRN

## 2018-01-27 MED ORDER — MORPHINE SULFATE (PF) 4 MG/ML IV SOLN
3.0000 mg | Freq: Once | INTRAVENOUS | Status: AC
  Administered 2018-01-27: 3 mg via INTRAVENOUS

## 2018-01-27 NOTE — ED Notes (Signed)
Patient transported to Ultrasound 

## 2018-01-27 NOTE — ED Provider Notes (Addendum)
[facility] EMERGENCY DEPARTMENT Provider Note   CSN: [id number] Arrival date & time: 01/26/18  1519     History   Chief Complaint Chief Complaint  Patient presents with  . Abdominal Pain    HPI Raymond Fitzgerald is a 47 y.o. male.   47 year old male with a history of dyslipidemia, HTN, hypothyroid, IDDM presents to the emergency department for evaluation of nausea and vomiting.  Symptoms began yesterday after eating at a buffet.  He has had at least 10 episodes of vomiting, all of which have been nonbloody.  Last episode of vomiting was just prior to arrival.  He has had looser stool which she would not classify as diarrhea.  It has not been watery and has been nonbloody.  He does report associated epigastric pain.  No reported outpatient fever, the patient is febrile in the emergency department today.  He has not been able to tolerate any of his home medications secondary to emesis.  No sick contacts or history of abdominal surgeries.     Past Medical History:  Diagnosis Date  . Diabetes mellitus without complication (HCC)   . Hyperlipidemia   . Thyroid disease     Patient Active Problem List   Diagnosis Date Noted  . Hypothyroidism 08/02/2015  . Uncontrolled diabetes mellitus type 2 without complications (HCC) 09/15/2014  . Essential hypertension 08/24/2014  . Hyperlipidemia 08/24/2014    History reviewed. No pertinent surgical history.      Home Medications    Prior to Admission medications   Medication Sig Start Date End Date Taking? Authorizing Provider  albuterol (PROVENTIL HFA;VENTOLIN HFA) 108 (90 Base) MCG/ACT inhaler Inhale 2 puffs into the lungs every 6 (six) hours as needed for wheezing or shortness of breath. 02/12/17  Yes Rumley, [place] N, DO  atorvastatin (LIPITOR) 20 MG tablet TAKE 1 TABLET(20 MG) BY MOUTH DAILY 10/08/17  Yes Carlus Pavlov, MD  Dulaglutide (TRULICITY) 1.5 MG/0.5ML SOPN Inject 1.5 mg under skin weekly Patient  taking differently: Inject 1.5 mg into the skin once a week. On Sunday 01/02/18  Yes Carlus Pavlov, MD  Insulin Detemir (LEVEMIR FLEXTOUCH) 100 UNIT/ML Pen INJECT 25 UNITS UNDER THE SKIN AT BEDTIME 01/02/18  Yes Carlus Pavlov, MD  INVOKANA 100 MG TABS tablet TAKE 1 TABLET BY MOUTH DAILY BEFORE BREAKFAST 01/20/18  Yes Carlus Pavlov, MD  levothyroxine (SYNTHROID, LEVOTHROID) 50 MCG tablet TAKE 1 TABLET(50 MCG) BY MOUTH DAILY BEFORE BREAKFAST 01/02/18  Yes Carlus Pavlov, MD  metFORMIN (GLUCOPHAGE) 1000 MG tablet TAKE 1 TABLET(1000 MG) BY MOUTH TWICE DAILY WITH A MEAL 01/02/18  Yes Carlus Pavlov, MD  BD PEN NEEDLE NANO U/F 32G X 4 MM MISC USE ONCE DAILY 12/22/17   Carlus Pavlov, MD  canagliflozin (INVOKANA) 100 MG TABS tablet TAKE 1 TABLET BY MOUTH DAILY BEFORE BREAKFAST. Patient not taking: Reported on 01/27/2018 01/02/18   Carlus Pavlov, MD  desonide (DESOWEN) 0.05 % cream Apply topically 2 (two) times daily. Patient not taking: Reported on 01/27/2018 02/12/17   Araceli Bouche, DO  fluticasone (FLONASE) 50 MCG/ACT nasal spray Place 2 sprays into both nostrils daily. Patient not taking: Reported on 01/27/2018 02/12/17   Araceli Bouche, DO  glipiZIDE (GLUCOTROL) 5 MG tablet TAKE 1 TABLET(5 MG) BY MOUTH before a large meal Patient not taking: Reported on 01/27/2018 06/04/17   Carlus Pavlov, MD  glucose blood (ONETOUCH VERIO) test strip Use to test blood sugar 2 times daily as instructed 08/19/17   Carlus Pavlov, MD  loratadine (CLARITIN)  10 MG tablet Take 1 tablet (10 mg total) by mouth daily. Patient not taking: Reported on 01/27/2018 02/12/17   Araceli Bouche, DO  ONETOUCH DELICA LANCETS 33G MISC CHECK BLOOD SUGAR TWICE DAILY AS DIRECTED 10/03/15   Carlus Pavlov, MD  Skin Protectants, Misc. (EUCERIN) cream Apply topically as needed for dry skin. Patient not taking: Reported on 01/27/2018 02/12/17   Araceli Bouche, DO    Family History No family history on  file.  Social History Social History   Tobacco Use  . Smoking status: Never Smoker  . Smokeless tobacco: Never Used  Substance Use Topics  . Alcohol use: Yes  . Drug use: No     Allergies   Penicillins   Review of Systems Review of Systems Ten systems reviewed and are negative for acute change, except as noted in the HPI.    Physical Exam Updated Vital Signs BP 105/72   Pulse (!) 106   Temp 99.7 F (37.6 C) (Oral)   Resp 18   SpO2 96%   Physical Exam  Constitutional: He is oriented to person, place, and time. He appears well-developed and well-nourished. No distress.  Nontoxic appearing and in no distress  HENT:  Head: Normocephalic and atraumatic.  Eyes: Conjunctivae and EOM are normal. No scleral icterus.  Neck: Normal range of motion.  Cardiovascular: Regular rhythm and intact distal pulses.  Tachycardia  Pulmonary/Chest: Effort normal. No stridor. No respiratory distress. He has no wheezes. He has no rales.  Lungs clear to auscultation bilaterally  Abdominal: Soft. He exhibits no mass. There is tenderness. There is guarding.  Tenderness to palpation in the epigastrium with voluntary guarding.  There is also mild tenderness in the right lower quadrant.  Abdomen is otherwise soft, nondistended.  No peritoneal signs.  Musculoskeletal: Normal range of motion.  Neurological: He is alert and oriented to person, place, and time. He exhibits normal muscle tone. Coordination normal.  GCS 15.  Patient moving all extremities.  Skin: Skin is warm and dry. No rash noted. He is not diaphoretic. No erythema. No pallor.  Psychiatric: He has a normal mood and affect. His behavior is normal.  Nursing note and vitals reviewed.    ED Treatments / Results  Labs (all labs ordered are listed, but only abnormal results are displayed) Labs Reviewed  COMPREHENSIVE METABOLIC PANEL - Abnormal; Notable for the following components:      Result Value   CO2 18 (*)    Glucose, Bld  168 (*)    Total Protein 8.2 (*)    Total Bilirubin 2.0 (*)    Anion gap 17 (*)    All other components within normal limits  CBC - Abnormal; Notable for the following components:   WBC 25.1 (*)    RBC 5.99 (*)    All other components within normal limits  URINALYSIS, ROUTINE W REFLEX MICROSCOPIC - Abnormal; Notable for the following components:   Specific Gravity, Urine 1.037 (*)    Glucose, UA >=500 (*)    Ketones, ur 80 (*)    All other components within normal limits  COMPREHENSIVE METABOLIC PANEL - Abnormal; Notable for the following components:   Sodium 133 (*)    CO2 18 (*)    Glucose, Bld 144 (*)    Calcium 7.8 (*)    Total Protein 6.1 (*)    Albumin 3.3 (*)    AST 13 (*)    ALT 16 (*)    Total Bilirubin 1.9 (*)  All other components within normal limits  CULTURE, BLOOD (ROUTINE X 2)  CULTURE, BLOOD (ROUTINE X 2)  LIPASE, BLOOD  I-STAT CG4 LACTIC ACID, ED    EKG None  Radiology US Abdomen Complete  Result Date: 01/27/2018 CLINICAL DATA:  Right upper quadrant and epigastric pain with vomiting x1 day. EXAM: ABDOMEN ULTRASOUND COMPLETE COMPARISON:  Same day CT FINDINGS: Gallbladder: Intraluminal shadowing 6 mm calculus noted within the gallbladder neck. No sonographic Murphy sign noted by sonographer gallbladder slightly distended up to 5 mm in thickness. Common bile duct: Diameter: Normal at 5.7 mm Liver: No focal lesion identified. Hepatic steatosis with focal fatty sparing. Portal vein is patent on color Doppler imaging with normal direction of blood flow towards the liver. IVC: No abnormality visualized. Pancreas: Visualized portion unremarkable. Most of the gland was obscured by overlying bowel. Spleen: Size and appearance within normal limits. Right Kidney: Length: 10.9 cm. Echogenicity within normal limits. No mass or hydronephrosis visualized. Left Kidney: Length: 11.9 cm. Echogenicity within normal limits. No mass or hydronephrosis visualized. Abdominal aorta: No  aneurysm visualized. Other findings: None. IMPRESSION: 1. Fatty liver with focal fatty sparing. 2. Mild thickening of the gallbladder wall up to 5 mm in single wall thickness (normal 3 mm or less) with distension similar to CT findings. A 6 mm calculus is noted near the neck of the gallbladder. Other than the gallbladder wall thickening, calculus and mild distension, no additional signs supporting acute cholecystitis are identified. Electronically Signed   By: Tollie Eth M.D.   On: 01/27/2018 03:48   Ct Abdomen Pelvis W Contrast  Result Date: 01/27/2018 CLINICAL DATA:  Nausea, vomiting and abdominal pain. EXAM: CT ABDOMEN AND PELVIS WITH CONTRAST TECHNIQUE: Multidetector CT imaging of the abdomen and pelvis was performed using the standard protocol following bolus administration of intravenous contrast. CONTRAST:  ISOVUE-300 IOPAMIDOL (ISOVUE-300) INJECTION 61% COMPARISON:  None. FINDINGS: Lower chest: Normal size heart. No pericardial effusion. Bibasilar atelectasis. No pneumothorax or pleural effusion. Hepatobiliary: The gallbladder appears distended without wall thickening. There is a 4 mm calculus without obstruction noted. Right upper quadrant mesenteric edema and pericholecystic edema is seen. It is uncertain whether this is secondary to a cholecystitis versus distal gastritis/peptic ulcer disease, less likely duodenitis given lack of duodenal thickening. Pancreas: The pancreas appears unremarkable without ductal dilatation. Spleen: Normal in size without focal abnormality. Adrenals/Urinary Tract: Normal bilateral adrenal glands. Symmetric enhancement of both kidneys. No nephrolithiasis nor obstructive uropathy. No hydroureteronephrosis. The bladder is distended without focal mural thickening or calculi. Stomach/Bowel: The gastric antrum and pyloric region appears somewhat thickened and edematous. Right upper quadrant mesenteric edema is also noted. Possibility of peptic ulcer disease and/or  gastritis is raised. No bowel obstruction. Mild fluid-filled distention of proximal jejunum. Vascular/Lymphatic: No significant vascular findings are present. No enlarged abdominal or pelvic lymph nodes. Reproductive: Prostate is unremarkable. Other: Mild herniation of properitoneal fat within the proximal inguinal canals bilaterally. Musculoskeletal: No acute or significant osseous findings. IMPRESSION: 1. The gallbladder is distended and contains a 4 mm calculus. There is mild soft tissue edema in the right upper quadrant. Differential possibilities may include changes from acute cholecystitis, peptic ulcer disease/gastritis, less likely duodenitis. A HIDA scan may help for further correlation. 2. Mild fluid-filled distention of proximal jejunal loops may also represent sympathetic distention/ileus versus mild small bowel enteritis. No mechanical bowel obstruction. Electronically Signed   By: Tollie Eth M.D.   On: 01/27/2018 01:05    Procedures Procedures (including critical care time)  Medications Ordered in ED Medications  iopamidol (ISOVUE-300) 61 % injection (has no administration in time range)  ciprofloxacin (CIPRO) IVPB 400 mg (400 mg Intravenous New Bag/Given 01/27/18 0338)  metroNIDAZOLE (FLAGYL) IVPB 500 mg (500 mg Intravenous New Bag/Given 01/27/18 0343)  sodium chloride 0.9 % bolus 1,000 mL (has no administration in time range)  sodium chloride 0.9 % bolus 2,000 mL (0 mLs Intravenous Stopped 01/27/18 0133)  ondansetron (ZOFRAN) injection 4 mg (4 mg Intravenous Given 01/27/18 0052)  morphine 4 MG/ML injection 4 mg (4 mg Intravenous Given 01/27/18 0052)  acetaminophen (TYLENOL) tablet 1,000 mg (1,000 mg Oral Given 01/27/18 0052)  famotidine (PEPCID) IVPB 20 mg premix (0 mg Intravenous Stopped 01/27/18 0134)  iopamidol (ISOVUE-300) 61 % injection 100 mL (100 mLs Intravenous Contrast Given 01/27/18 0025)  sodium chloride 0.9 % bolus 1,000 mL (0 mLs Intravenous Stopped 01/27/18 0340)  morphine 4  MG/ML injection 2 mg (2 mg Intravenous Given 01/27/18 0236)    CRITICAL CARE Performed by: Antony MaduraKelly Lachanda Buczek   Total critical care time: 35 minutes  Critical care time was exclusive of separately billable procedures and treating other patients.  Critical care was necessary to treat or prevent imminent or life-threatening deterioration.  Critical care was time spent personally by me on the following activities: development of treatment plan with patient and/or surrogate as well as nursing, discussions with consultants, evaluation of patient's response to treatment, examination of patient, obtaining history from patient or surrogate, ordering and performing treatments and interventions, ordering and review of laboratory studies, ordering and review of radiographic studies, pulse oximetry and re-evaluation of patient's condition.   Initial Impression / Assessment and Plan / ED Course  I have reviewed the triage vital signs and the nursing notes.  Pertinent labs & imaging results that were available during my care of the patient were reviewed by me and considered in my medical decision making (see chart for details).     2:10 AM Patient reassessed.  He continues to be tachycardic, though fever has improved.  He reports persistent upper abdominal pain and has tenderness in his epigastrium and right upper quadrant on repeat assessment.  Abdominal exam is, overall, slightly improved.  He has a negative Murphy sign.  I have discussed the changes on the patient's CT scan.  Patient and wife verbalized understanding.  I do believe it would be beneficial to obtain right upper quadrant ultrasound to specifically evaluate for acute cholecystitis as this could not be excluded on CT.  The possibility of this remains concerning with leukocytosis and fever, though this may also be secondary to gastroenteritis.  CT imaging does also support this diagnosis.  Patient receiving third liter of IV fluids.  He is  requesting something additional for pain.  Phlebotomy at bedside drawing blood for repeat metabolic panel to evaluate acidosis.  Will also allow for trending of LFTs.  3:56 AM Ultrasound shows mild thickening of the gallbladder wall with slight distention.  There is a 6 mm calculus near the gallbladder neck.  No documentation of any pericholecystic fluid.  Normal CBD diameter.  Finding slightly equivocal for acute cholecystitis; however, patient being covered with antibiotics.  Patient continues to be tachycardic.  Systolic blood pressure at 98.  Additional IV fluids ordered.  He has already received 3 L IV fluids.  Plan for admission to the hospital for additional hydration.  Patient may benefit from HIDA or MRCP in the morning if symptoms persist.  [facility]  4:03 AM  Case discussed with Dr. Antionette Char who will admit.   Final Clinical Impressions(s) / ED Diagnoses   Final diagnoses:  Gastroenteritis  Sepsis, due to unspecified organism [facility])    ED Discharge Orders    None       Antony Madura, PA-C 01/27/18 0404    Antony Madura, PA-C 01/27/18 0404    Devoria Albe, MD 01/27/18 [phone]

## 2018-01-27 NOTE — Consult Note (Signed)
[facility] Consult/Admission Note  Raymond Fitzgerald Mar 02, 1971  [id number].    Requesting MD: Dr. Lorin Mercy Chief Complaint/Reason for Consult: Cholelithiasis  HPI:   Pt is a 47 year old male with a history of poorly controlled diabetes, hypothyroidism, HLD who presented to the ED with complaints of N, V, abdominal pain. Pt states nausea and vomiting started at 1800 on Sunday. He states he ate at a buffet with his family 2 hours prior. He believes they all ate the same thing. He states the pain started after the vomiting. Pain is in the epigastric region and radiates to the back. Pain is severe at times. Nothing made it better. Emesis consisted of food eaten and yellow liquid. Associated fatigue, mild headache, and subjective fevers. No history of abdominal surgeries, no anticoagulation. No diarrhea but pt states loose stools.   ROS:  Review of Systems  Constitutional: Negative for chills, diaphoresis and fever.  HENT: Negative for sore throat.   Respiratory: Negative for cough and shortness of breath.   Cardiovascular: Negative for chest pain.  Gastrointestinal: Positive for abdominal pain, nausea and vomiting. Negative for blood in stool, constipation and diarrhea.  Genitourinary: Negative for dysuria.  Musculoskeletal: Positive for back pain (abdominal pain radiates into back).  Skin: Negative for rash.  Neurological: Positive for headaches. Negative for dizziness, focal weakness and loss of consciousness.  All other systems reviewed and are negative.    History reviewed. No pertinent family history.  Past Medical History:  Diagnosis Date  . Diabetes mellitus without complication ([place])   . Hyperlipidemia   . Thyroid disease     History reviewed. No pertinent surgical history.  Social History:  reports that he has never smoked. He has never used smokeless tobacco. He reports that he drinks alcohol. He reports that he does not use drugs.  Allergies:  Allergies   Allergen Reactions  . Penicillins Hives and Swelling     (Not in a hospital admission)  Blood pressure 108/69, pulse (!) 101, temperature 99.7 F (37.6 C), temperature source Oral, resp. rate 15, SpO2 99 %.  Physical Exam  Constitutional: He is oriented to person, place, and time. He appears well-developed and well-nourished.  Non-toxic appearance. He does not appear ill. No distress.  HENT:  Head: Normocephalic and atraumatic.  Nose: Nose normal.  Mouth/Throat: Oropharynx is clear and moist. No oropharyngeal exudate.  Eyes: Pupils are equal, round, and reactive to light. Conjunctivae are normal. Right eye exhibits no discharge. Left eye exhibits no discharge. No scleral icterus.  Neck: Normal range of motion. Neck supple. No thyromegaly present.  Cardiovascular: Normal rate, regular rhythm, normal heart sounds and intact distal pulses.  No murmur heard. Pulses:      Radial pulses are 2+ on the right side, and 2+ on the left side.  Pulmonary/Chest: Effort normal and breath sounds normal. No respiratory distress. He has no wheezes. He has no rhonchi. He has no rales.  Abdominal: Soft. Normal appearance and bowel sounds are normal. He exhibits no distension. There is no hepatosplenomegaly. There is tenderness in the right upper quadrant and epigastric area. There is guarding. There is no rigidity.  Guarding of RUQ and epigastric region, no signs of peritonitis   Musculoskeletal: Normal range of motion. He exhibits no edema, tenderness or deformity.  Lymphadenopathy:    He has no cervical adenopathy.  Neurological: He is alert and oriented to person, place, and time.  Skin: Skin is warm and dry. No rash noted. He is not diaphoretic.  Psychiatric: He has a normal mood and affect.  Nursing note and vitals reviewed.   Results for orders placed or performed during the hospital encounter of 01/26/18 (from the past 48 hour(s))  Lipase, blood     Status: None   Collection Time: 01/26/18   4:05 PM  Result Value Ref Range   Lipase 23 11 - 51 U/L    Comment: Performed at [facility], [address] [place], [place] [address]  Comprehensive metabolic panel     Status: Abnormal   Collection Time: 01/26/18  4:05 PM  Result Value Ref Range   Sodium 137 135 - 145 mmol/L   Potassium 4.4 3.5 - 5.1 mmol/L   Chloride 102 101 - 111 mmol/L   CO2 18 (L) 22 - 32 mmol/L   Glucose, Bld 168 (H) 65 - 99 mg/dL   BUN 14 6 - 20 mg/dL   Creatinine, Ser 0.80 0.61 - 1.24 mg/dL   Calcium 9.3 8.9 - 10.3 mg/dL   Total Protein 8.2 (H) 6.5 - 8.1 g/dL   Albumin 4.3 3.5 - 5.0 g/dL   AST 15 15 - 41 U/L   ALT 21 17 - 63 U/L   Alkaline Phosphatase 70 38 - 126 U/L   Total Bilirubin 2.0 (H) 0.3 - 1.2 mg/dL   GFR calc non Af Amer >60 >60 mL/min   GFR calc Af Amer >60 >60 mL/min    Comment: (NOTE) The eGFR has been calculated using the CKD EPI equation. This calculation has not been validated in all clinical situations. eGFR's persistently <60 mL/min signify possible Chronic Kidney Disease.    Anion gap 17 (H) 5 - 15    Comment: Performed at [facility], [address] [place], [place] [address]  CBC     Status: Abnormal   Collection Time: 01/26/18  4:05 PM  Result Value Ref Range   WBC 25.1 (H) 4.0 - 10.5 K/uL   RBC 5.99 (H) 4.22 - 5.81 MIL/uL   Hemoglobin 16.9 13.0 - 17.0 g/dL   HCT 48.5 39.0 - 52.0 %   MCV 81.0 78.0 - 100.0 fL   MCH 28.2 26.0 - 34.0 pg   MCHC 34.8 30.0 - 36.0 g/dL   RDW 13.3 11.5 - 15.5 %   Platelets 209 150 - 400 K/uL    Comment: Performed at [facility], [address] [place], [place] [address]  Urinalysis, Routine w reflex microscopic     Status: Abnormal   Collection Time: 01/26/18  9:16 PM  Result Value Ref Range   Color, Urine YELLOW YELLOW   APPearance CLEAR CLEAR   Specific Gravity, Urine 1.037 (H) 1.005 - 1.030   pH 5.0 5.0 - 8.0   Glucose, UA >=500 (A) NEGATIVE mg/dL   Hgb urine dipstick NEGATIVE NEGATIVE   Bilirubin Urine NEGATIVE  NEGATIVE   Ketones, ur 80 (A) NEGATIVE mg/dL   Protein, ur NEGATIVE NEGATIVE mg/dL   Nitrite NEGATIVE NEGATIVE   Leukocytes, UA NEGATIVE NEGATIVE   RBC / HPF 0-5 0 - 5 RBC/hpf   WBC, UA 0-5 0 - 5 WBC/hpf   Bacteria, UA NONE SEEN NONE SEEN   Squamous Epithelial / LPF NONE SEEN NONE SEEN    Comment: Performed at [facility], [address] [place], [place] [address]  Comprehensive metabolic panel     Status: Abnormal   Collection Time: 01/27/18  2:05 AM  Result Value Ref Range   Sodium 133 (L)   135 - 145 mmol/L   Potassium 3.8 3.5 - 5.1 mmol/L   Chloride 103 101 - 111 mmol/L   CO2 18 (L) 22 - 32 mmol/L   Glucose, Bld 144 (H) 65 - 99 mg/dL   BUN 12 6 - 20 mg/dL   Creatinine, Ser 0.75 0.61 - 1.24 mg/dL   Calcium 7.8 (L) 8.9 - 10.3 mg/dL   Total Protein 6.1 (L) 6.5 - 8.1 g/dL   Albumin 3.3 (L) 3.5 - 5.0 g/dL   AST 13 (L) 15 - 41 U/L   ALT 16 (L) 17 - 63 U/L   Alkaline Phosphatase 48 38 - 126 U/L   Total Bilirubin 1.9 (H) 0.3 - 1.2 mg/dL   GFR calc non Af Amer >60 >60 mL/min   GFR calc Af Amer >60 >60 mL/min    Comment: (NOTE) The eGFR has been calculated using the CKD EPI equation. This calculation has not been validated in all clinical situations. eGFR's persistently <60 mL/min signify possible Chronic Kidney Disease.    Anion gap 12 5 - 15    Comment: Performed at [facility] [address], [place], [place] [address]  I-Stat CG4 Lactic Acid, ED     Status: None   Collection Time: 01/27/18  3:38 AM  Result Value Ref Range   Lactic Acid, Venous 1.18 0.5 - 1.9 mmol/L   US Abdomen Complete  Result Date: 01/27/2018 CLINICAL DATA:  Right upper quadrant and epigastric pain with vomiting x1 day. EXAM: ABDOMEN ULTRASOUND COMPLETE COMPARISON:  Same day CT FINDINGS: Gallbladder: Intraluminal shadowing 6 mm calculus noted within the gallbladder neck. No sonographic Murphy sign noted by sonographer gallbladder slightly distended up to 5 mm in thickness. Common bile duct:  Diameter: Normal at 5.7 mm Liver: No focal lesion identified. Hepatic steatosis with focal fatty sparing. Portal vein is patent on color Doppler imaging with normal direction of blood flow towards the liver. IVC: No abnormality visualized. Pancreas: Visualized portion unremarkable. Most of the gland was obscured by overlying bowel. Spleen: Size and appearance within normal limits. Right Kidney: Length: 10.9 cm. Echogenicity within normal limits. No mass or hydronephrosis visualized. Left Kidney: Length: 11.9 cm. Echogenicity within normal limits. No mass or hydronephrosis visualized. Abdominal aorta: No aneurysm visualized. Other findings: None. IMPRESSION: 1. Fatty liver with focal fatty sparing. 2. Mild thickening of the gallbladder wall up to 5 mm in single wall thickness (normal 3 mm or less) with distension similar to CT findings. A 6 mm calculus is noted near the neck of the gallbladder. Other than the gallbladder wall thickening, calculus and mild distension, no additional signs supporting acute cholecystitis are identified. Electronically Signed   By: Ashley Royalty M.D.   On: 01/27/2018 03:48   Ct Abdomen Pelvis W Contrast  Result Date: 01/27/2018 CLINICAL DATA:  Nausea, vomiting and abdominal pain. EXAM: CT ABDOMEN AND PELVIS WITH CONTRAST TECHNIQUE: Multidetector CT imaging of the abdomen and pelvis was performed using the standard protocol following bolus administration of intravenous contrast. CONTRAST:  168m ISOVUE-300 IOPAMIDOL (ISOVUE-300) INJECTION 61% COMPARISON:  None. FINDINGS: Lower chest: Normal size heart. No pericardial effusion. Bibasilar atelectasis. No pneumothorax or pleural effusion. Hepatobiliary: The gallbladder appears distended without wall thickening. There is a 4 mm calculus without obstruction noted. Right upper quadrant mesenteric edema and pericholecystic edema is seen. It is uncertain whether this is secondary to a cholecystitis versus distal gastritis/peptic ulcer disease,  less likely duodenitis given lack of duodenal thickening. Pancreas: The pancreas appears unremarkable without  ductal dilatation. Spleen: Normal in size without focal abnormality. Adrenals/Urinary Tract: Normal bilateral adrenal glands. Symmetric enhancement of both kidneys. No nephrolithiasis nor obstructive uropathy. No hydroureteronephrosis. The bladder is distended without focal mural thickening or calculi. Stomach/Bowel: The gastric antrum and pyloric region appears somewhat thickened and edematous. Right upper quadrant mesenteric edema is also noted. Possibility of peptic ulcer disease and/or gastritis is raised. No bowel obstruction. Mild fluid-filled distention of proximal jejunum. Vascular/Lymphatic: No significant vascular findings are present. No enlarged abdominal or pelvic lymph nodes. Reproductive: Prostate is unremarkable. Other: Mild herniation of properitoneal fat within the proximal inguinal canals bilaterally. Musculoskeletal: No acute or significant osseous findings. IMPRESSION: 1. The gallbladder is distended and contains a 4 mm calculus. There is mild soft tissue edema in the right upper quadrant. Differential possibilities may include changes from acute cholecystitis, peptic ulcer disease/gastritis, less likely duodenitis. A HIDA scan may help for further correlation. 2. Mild fluid-filled distention of proximal jejunal loops may also represent sympathetic distention/ileus versus mild small bowel enteritis. No mechanical bowel obstruction. Electronically Signed   By: Ashley Royalty M.D.   On: 01/27/2018 01:05      Assessment/Plan HLD DM on insulin Hypothyroidism  Sepsis Abdominal pain with N & V - 70m stone near neck of gallbadder seen on [place] mild wall thickening 568m- CT showed Mild fluid-filled distention of proximal jejunal loops may also represent sympathetic distention/ileus versus mild small bowel enteritis. No mechanical bowel obstruction. - LFT's WNL, Tbili 1.9, WBC 25.1, T  max 101.2 - HIDA pending   - We will follow. Thank you for the consult.   JeKalman DrapePANorth [facility] 01/27/2018, 7:51 AM Pager: [phone]: [phone]-Fri 7:00 am-4:30 pm Sat-Sun 7:00 am-11:30 am

## 2018-01-27 NOTE — ED Notes (Signed)
Dr Yeatts at bedside 

## 2018-01-27 NOTE — ED Notes (Signed)
Pt back in room from CT 

## 2018-01-27 NOTE — Care Management Note (Addendum)
Case Management Note  Patient Details  Name: Raymond Fitzgerald MRN: [id number] Date of Birth: [birth year]  Subjective/Objective:   History of poorly controlled diabetes, hypothyroidism, HLD.  Admitted for Sepsis          Action/Plan: PCP: [facility]. Prior to admission patient lived at home with wife and 2 children; NOK: wife, [phone]. Will be returning to the same living situation after discharge.  At discharge, patient has transportation home.  Patient has the ability to pay for  medications and food. NCM will continue to monitor for discharge transition needs.  Expected Discharge Date:    to be determined              Expected Discharge Plan:   Home self care.  In-House Referral:    N/A Discharge planning Services   CM consult  Status of Service:   In progress, will continue to monitor.  Yancey FlemingsKimberly R Becton, RN 01/27/2018, 1:36 PM

## 2018-01-27 NOTE — ED Notes (Signed)
Patient transported to CT 

## 2018-01-27 NOTE — ED Notes (Signed)
ED Provider at bedside. 

## 2018-01-27 NOTE — H&P (Signed)
History and Physical    [name] FAO:[id number] DOB: 10-02-1971 DOA: 01/26/2018  PCP: [facility]  Consultants:  Elvera Lennox - endocrinology Patient coming from:  Home - lives with wife and 2 children; NOK: wife, [phone]  Chief Complaint:  Abdominal pain, vomiting  HPI: Raymond Fitzgerald is a 47 y.o. male with medical history significant of IDDM, hypothyroidism, and HLD presenting with epigastric pain with vomiting for a couple of hours.  He thought it was food poisoning. Symptoms started Sunday evening about 6.  He slept overnight and got up in the AM but he was very weak so he went to the doctor and was sent to the ER due to dehydration.  No h/o prior.  No sick contacts.  No diarrhea.  Vomited at least 10 times.  He was feeling well until then without symptoms.  They went for a buffet together ([place]) and they all ate the same things but he was the only one who got sick.  None of the food tasted bad.  Fever yesterday but none prior.  Intermittent cough which causes his abdominal pain to worsen.   ED Course:  Epigastric pain, n/v without diarrhea.  CT/US with possible acute cholecystitis.  IVF.  Consider HIDA or MRCP.  Review of Systems: As per HPI; otherwise review of systems reviewed and negative.   Ambulatory Status:  Ambulates without assistance  Past Medical History:  Diagnosis Date  . Diabetes mellitus without complication (HCC)   . Hyperlipidemia   . Thyroid disease     History reviewed. No pertinent surgical history.  Social History   Socioeconomic History  . Marital status: Married    Spouse name: Not on file  . Number of children: Not on file  . Years of education: Not on file  . Highest education level: Not on file  Occupational History  . Occupation: owns a store in the mall  Social Needs  . Financial resource strain: Not on file  . Food insecurity:    Worry: Not on file    Inability: Not on file  . Transportation needs:    Medical: Not  on file    Non-medical: Not on file  Tobacco Use  . Smoking status: Never Smoker  . Smokeless tobacco: Never Used  Substance and Sexual Activity  . Alcohol use: Yes    Comment: occasional  . Drug use: No  . Sexual activity: Not on file  Lifestyle  . Physical activity:    Days per week: Not on file    Minutes per session: Not on file  . Stress: Not on file  Relationships  . Social connections:    Talks on phone: Not on file    Gets together: Not on file    Attends religious service: Not on file    Active member of club or organization: Not on file    Attends meetings of clubs or organizations: Not on file    Relationship status: Not on file  . Intimate partner violence:    Fear of current or ex partner: Not on file    Emotionally abused: Not on file    Physically abused: Not on file    Forced sexual activity: Not on file  Other Topics Concern  . Not on file  Social History Narrative  . Not on file    Allergies  Allergen Reactions  . Penicillins Hives and Swelling    History reviewed. No pertinent family history.  Prior to Admission medications   Medication  Sig Start Date End Date Taking? Authorizing Provider  albuterol (PROVENTIL HFA;VENTOLIN HFA) 108 (90 Base) MCG/ACT inhaler Inhale 2 puffs into the lungs every 6 (six) hours as needed for wheezing or shortness of breath. 02/12/17  Yes Rumley, Poquott N, DO  atorvastatin (LIPITOR) 20 MG tablet TAKE 1 TABLET(20 MG) BY MOUTH DAILY 10/08/17  Yes Carlus Pavlov, MD  Dulaglutide (TRULICITY) 1.5 MG/0.5ML SOPN Inject 1.5 mg under skin weekly Patient taking differently: Inject 1.5 mg into the skin once a week. On Sunday 01/02/18  Yes Carlus Pavlov, MD  Insulin Detemir (LEVEMIR FLEXTOUCH) 100 UNIT/ML Pen INJECT 25 UNITS UNDER THE SKIN AT BEDTIME 01/02/18  Yes Carlus Pavlov, MD  INVOKANA 100 MG TABS tablet TAKE 1 TABLET BY MOUTH DAILY BEFORE BREAKFAST 01/20/18  Yes Carlus Pavlov, MD  levothyroxine (SYNTHROID, LEVOTHROID)  50 MCG tablet TAKE 1 TABLET(50 MCG) BY MOUTH DAILY BEFORE BREAKFAST 01/02/18  Yes Carlus Pavlov, MD  metFORMIN (GLUCOPHAGE) 1000 MG tablet TAKE 1 TABLET(1000 MG) BY MOUTH TWICE DAILY WITH A MEAL 01/02/18  Yes Carlus Pavlov, MD  BD PEN NEEDLE NANO U/F 32G X 4 MM MISC USE ONCE DAILY 12/22/17   Carlus Pavlov, MD  canagliflozin (INVOKANA) 100 MG TABS tablet TAKE 1 TABLET BY MOUTH DAILY BEFORE BREAKFAST. Patient not taking: Reported on 01/27/2018 01/02/18   Carlus Pavlov, MD  desonide (DESOWEN) 0.05 % cream Apply topically 2 (two) times daily. Patient not taking: Reported on 01/27/2018 02/12/17   Araceli Bouche, DO  fluticasone (FLONASE) 50 MCG/ACT nasal spray Place 2 sprays into both nostrils daily. Patient not taking: Reported on 01/27/2018 02/12/17   Araceli Bouche, DO  glipiZIDE (GLUCOTROL) 5 MG tablet TAKE 1 TABLET(5 MG) BY MOUTH before a large meal Patient not taking: Reported on 01/27/2018 06/04/17   Carlus Pavlov, MD  glucose blood (ONETOUCH VERIO) test strip Use to test blood sugar 2 times daily as instructed 08/19/17   Carlus Pavlov, MD  loratadine (CLARITIN) 10 MG tablet Take 1 tablet (10 mg total) by mouth daily. Patient not taking: Reported on 01/27/2018 02/12/17   Araceli Bouche, DO  ONETOUCH DELICA LANCETS 33G MISC CHECK BLOOD SUGAR TWICE DAILY AS DIRECTED 10/03/15   Carlus Pavlov, MD  Skin Protectants, Misc. (EUCERIN) cream Apply topically as needed for dry skin. Patient not taking: Reported on 01/27/2018 02/12/17   Araceli Bouche, DO    Physical Exam: Vitals:   01/27/18 0545 01/27/18 0630 01/27/18 0700 01/27/18 0730  BP: 109/63 (!) 109/55 105/63 108/69  Pulse: 93 94 98 (!) 101  Resp: 14 13 15 15   Temp:      TempSrc:      SpO2: 93% 97% 98% 99%     General:  Appears calm and comfortable and is NAD Eyes:  EOMI, normal lids, iris ENT:  grossly normal hearing, lips & tongue, mmm Neck:  no LAD, masses or thyromegaly Cardiovascular:  RRR, no m/r/g. No LE  edema.  Respiratory:   CTA bilaterally with no wheezes/rales/rhonchi.  Normal respiratory effort. Abdomen:  soft, TTP in midepigastric and RUQ regions, ND overall with mild fullness in the RUQ, NABS Skin:  no rash or induration seen on limited exam Musculoskeletal:  grossly normal tone BUE/BLE, good ROM, no bony abnormality Lower extremity:  No LE edema.  Limited foot exam with no ulcerations.  2+ distal pulses. Psychiatric:  grossly normal mood and affect, speech fluent and appropriate, AOx3 Neurologic:  CN 2-12 grossly intact, moves all extremities in coordinated fashion, sensation intact  Radiological Exams on Admission: US Abdomen Complete  Result Date: 01/27/2018 CLINICAL DATA:  Right upper quadrant and epigastric pain with vomiting x1 day. EXAM: ABDOMEN ULTRASOUND COMPLETE COMPARISON:  Same day CT FINDINGS: Gallbladder: Intraluminal shadowing 6 mm calculus noted within the gallbladder neck. No sonographic Murphy sign noted by sonographer gallbladder slightly distended up to 5 mm in thickness. Common bile duct: Diameter: Normal at 5.7 mm Liver: No focal lesion identified. Hepatic steatosis with focal fatty sparing. Portal vein is patent on color Doppler imaging with normal direction of blood flow towards the liver. IVC: No abnormality visualized. Pancreas: Visualized portion unremarkable. Most of the gland was obscured by overlying bowel. Spleen: Size and appearance within normal limits. Right Kidney: Length: 10.9 cm. Echogenicity within normal limits. No mass or hydronephrosis visualized. Left Kidney: Length: 11.9 cm. Echogenicity within normal limits. No mass or hydronephrosis visualized. Abdominal aorta: No aneurysm visualized. Other findings: None. IMPRESSION: 1. Fatty liver with focal fatty sparing. 2. Mild thickening of the gallbladder wall up to 5 mm in single wall thickness (normal 3 mm or less) with distension similar to CT findings. A 6 mm calculus is noted near the neck of the  gallbladder. Other than the gallbladder wall thickening, calculus and mild distension, no additional signs supporting acute cholecystitis are identified. Electronically Signed   By: Tollie Eth M.D.   On: 01/27/2018 03:48   Ct Abdomen Pelvis W Contrast  Result Date: 01/27/2018 CLINICAL DATA:  Nausea, vomiting and abdominal pain. EXAM: CT ABDOMEN AND PELVIS WITH CONTRAST TECHNIQUE: Multidetector CT imaging of the abdomen and pelvis was performed using the standard protocol following bolus administration of intravenous contrast. CONTRAST:  ISOVUE-300 IOPAMIDOL (ISOVUE-300) INJECTION 61% COMPARISON:  None. FINDINGS: Lower chest: Normal size heart. No pericardial effusion. Bibasilar atelectasis. No pneumothorax or pleural effusion. Hepatobiliary: The gallbladder appears distended without wall thickening. There is a 4 mm calculus without obstruction noted. Right upper quadrant mesenteric edema and pericholecystic edema is seen. It is uncertain whether this is secondary to a cholecystitis versus distal gastritis/peptic ulcer disease, less likely duodenitis given lack of duodenal thickening. Pancreas: The pancreas appears unremarkable without ductal dilatation. Spleen: Normal in size without focal abnormality. Adrenals/Urinary Tract: Normal bilateral adrenal glands. Symmetric enhancement of both kidneys. No nephrolithiasis nor obstructive uropathy. No hydroureteronephrosis. The bladder is distended without focal mural thickening or calculi. Stomach/Bowel: The gastric antrum and pyloric region appears somewhat thickened and edematous. Right upper quadrant mesenteric edema is also noted. Possibility of peptic ulcer disease and/or gastritis is raised. No bowel obstruction. Mild fluid-filled distention of proximal jejunum. Vascular/Lymphatic: No significant vascular findings are present. No enlarged abdominal or pelvic lymph nodes. Reproductive: Prostate is unremarkable. Other: Mild herniation of properitoneal fat  within the proximal inguinal canals bilaterally. Musculoskeletal: No acute or significant osseous findings. IMPRESSION: 1. The gallbladder is distended and contains a 4 mm calculus. There is mild soft tissue edema in the right upper quadrant. Differential possibilities may include changes from acute cholecystitis, peptic ulcer disease/gastritis, less likely duodenitis. A HIDA scan may help for further correlation. 2. Mild fluid-filled distention of proximal jejunal loops may also represent sympathetic distention/ileus versus mild small bowel enteritis. No mechanical bowel obstruction. Electronically Signed   By: Tollie Eth M.D.   On: 01/27/2018 01:05    EKG: not done   Labs on Admission: I have personally reviewed the available labs and imaging studies at the time of the admission.  Pertinent labs:   CO2 18 Glucose 144 Albumin 3.3 Bilirubin 1.9  Lactate 1.18 Blood cultures pending  WBC 25.1 UA: >500 glucose; 80 ketones  Assessment/Plan Principal Problem:   Sepsis (HCC) Active Problems:   Hyperlipidemia   Uncontrolled diabetes mellitus type 2 without complications (HCC)   Hypothyroidism   Acute cholecystitis   Possible Sepsis, likely from acute cholecystitis -SIRS criteria in this patient includes: Leukocytosis, fever, tachycardia  -Patient does not, however, have current evidence of acute organ failure  -While awaiting blood cultures, this could be a preseptic condition. -Sepsis protocol initiated -Imaging indicates a 6 mm gallstone adjacent to the neck of the gallbladder; while his symptoms could be attributed to gastroenteritis, it seems more likely that this stone migrated into the neck of the gallbladder and resulted in acute inflammation -Surgery is planning to order a HIDA scan today -Clear liquids after HIDA, NPO after midnight if positive for cholecystectomy tomorrow -Morphine for pain, Zofran for nausea -Surgery consult -Empiric coverage with Cipro/Flagyl for  now -Blood and urine cultures pending -Will admit and continue to monitor -Will add HIV  DM -Recent A1c indicates good control -hold Trulicity, Invokana, Glucophage -Continue Levemir -Cover with moderate-scale SSI  HLD -Continue Lipitor  Hypothyroidism -Check TSH -Continue Synthroid at current dose for now   DVT prophylaxis: SCDs Code Status:  Full - confirmed with patient/family Family Communication: Spoke with wife by telephone  Disposition Plan:  Home once clinically improved Consults called: Surgery  Admission status: Admit - It is my clinical opinion that admission to INPATIENT is reasonable and necessary because of the expectation that this patient will require hospital care that crosses at least 2 midnights to treat this condition based on the medical complexity of the problems presented.  Given the aforementioned information, the predictability of an adverse outcome is felt to be significant.     Raymond BlueJennifer Everlie Eble MD [facility]  If note is complete, please contact covering daytime or nighttime physician. [email or web address] Password [email or web address]  01/27/2018, 8:05 AM

## 2018-01-27 NOTE — Progress Notes (Signed)
New Admission Note:   Arrival Method:  Mental Orientation: A& O x4  Telemetry: box 15 running Tach 113  Assessment: Completed Skin: Intact IV: Lactated Ringer at 150 ml  Pain: 5 Tubes: None Safety Measures: Safety Fall Prevention Plan has been discussed  Admission: From ED 4/22 79M Orientation: Patient has been orientated to the room, unit and staff.   Family: Son with him   Orders to be reviewed and implemented. Will continue to monitor the patient. Call light has been placed within reach and bed alarm has been activated.   Norman ClayNichola Eliannah Hinde, RN [address] [place]  Phone: [phone]

## 2018-01-28 ENCOUNTER — Inpatient Hospital Stay (HOSPITAL_COMMUNITY): Payer: BLUE CROSS/BLUE SHIELD | Admitting: Anesthesiology

## 2018-01-28 ENCOUNTER — Encounter (HOSPITAL_COMMUNITY): Payer: Self-pay | Admitting: Certified Registered"

## 2018-01-28 ENCOUNTER — Encounter (HOSPITAL_COMMUNITY)
Admission: EM | Disposition: A | Discharge status: Home / Self Care | Payer: Self-pay | Source: Home / Self Care | Attending: Family Medicine

## 2018-01-28 HISTORY — PX: CHOLECYSTECTOMY: SHX55

## 2018-01-28 LAB — COMPREHENSIVE METABOLIC PANEL
ALK PHOS: 67 U/L (ref 38–126)
ALT: 28 U/L (ref 17–63)
ANION GAP: 10 (ref 5–15)
AST: 20 U/L (ref 15–41)
Albumin: 2.8 g/dL — ABNORMAL LOW (ref 3.5–5.0)
BILIRUBIN TOTAL: 1.8 mg/dL — AB (ref 0.3–1.2)
BUN: 7 mg/dL (ref 6–20)
CALCIUM: 8 mg/dL — AB (ref 8.9–10.3)
CO2: 22 mmol/L (ref 22–32)
CREATININE: 0.8 mg/dL (ref 0.61–1.24)
Chloride: 104 mmol/L (ref 101–111)
Glucose, Bld: 107 mg/dL — ABNORMAL HIGH (ref 65–99)
Potassium: 3.7 mmol/L (ref 3.5–5.1)
SODIUM: 136 mmol/L (ref 135–145)
TOTAL PROTEIN: 6.1 g/dL — AB (ref 6.5–8.1)

## 2018-01-28 LAB — GLUCOSE, CAPILLARY
GLUCOSE-CAPILLARY: 110 mg/dL — AB (ref 65–99)
GLUCOSE-CAPILLARY: 242 mg/dL — AB (ref 65–99)
Glucose-Capillary: 110 mg/dL — ABNORMAL HIGH (ref 65–99)
Glucose-Capillary: 125 mg/dL — ABNORMAL HIGH (ref 65–99)
Glucose-Capillary: 126 mg/dL — ABNORMAL HIGH (ref 65–99)
Glucose-Capillary: 138 mg/dL — ABNORMAL HIGH (ref 65–99)

## 2018-01-28 LAB — CBC
HEMATOCRIT: 39.7 % (ref 39.0–52.0)
HEMOGLOBIN: 13.5 g/dL (ref 13.0–17.0)
MCH: 27.5 pg (ref 26.0–34.0)
MCHC: 34 g/dL (ref 30.0–36.0)
MCV: 80.9 fL (ref 78.0–100.0)
Platelets: 171 10*3/uL (ref 150–400)
RBC: 4.91 MIL/uL (ref 4.22–5.81)
RDW: 12.8 % (ref 11.5–15.5)
WBC: 16 10*3/uL — ABNORMAL HIGH (ref 4.0–10.5)

## 2018-01-28 LAB — HIV ANTIBODY (ROUTINE TESTING W REFLEX): HIV SCREEN 4TH GENERATION: NONREACTIVE

## 2018-01-28 LAB — SURGICAL PCR SCREEN
MRSA, PCR: NEGATIVE
STAPHYLOCOCCUS AUREUS: NEGATIVE

## 2018-01-28 SURGERY — LAPAROSCOPIC CHOLECYSTECTOMY WITH INTRAOPERATIVE CHOLANGIOGRAM
Anesthesia: General | Site: Abdomen

## 2018-01-28 MED ORDER — OXYCODONE HCL 5 MG/5ML PO SOLN: 5.0000 mg | Freq: Once | ORAL | Status: DC | PRN

## 2018-01-28 MED ORDER — ENOXAPARIN SODIUM 40 MG/0.4ML ~~LOC~~ SOLN
40.0000 mg | SUBCUTANEOUS | Status: DC
  Administered 2018-01-29: 40 mg via SUBCUTANEOUS
  Filled 2018-01-28: qty 0.4

## 2018-01-28 MED ORDER — MEPERIDINE HCL 50 MG/ML IJ SOLN: 6.2500 mg | INTRAMUSCULAR | Status: DC | PRN

## 2018-01-28 MED ORDER — FENTANYL CITRATE (PF) 100 MCG/2ML IJ SOLN
25.0000 ug | INTRAMUSCULAR | Status: DC | PRN
  Administered 2018-01-28 (×2): 50 ug via INTRAVENOUS

## 2018-01-28 MED ORDER — LIDOCAINE 2% (20 MG/ML) 5 ML SYRINGE
INTRAMUSCULAR | Status: AC
  Filled 2018-01-28: qty 15

## 2018-01-28 MED ORDER — HEMOSTATIC AGENTS (NO CHARGE) OPTIME
TOPICAL | Status: DC | PRN
  Administered 2018-01-28: 1 via TOPICAL

## 2018-01-28 MED ORDER — ONDANSETRON HCL 4 MG/2ML IJ SOLN: 4.0000 mg | Freq: Once | INTRAMUSCULAR | Status: DC | PRN

## 2018-01-28 MED ORDER — STERILE WATER FOR IRRIGATION IR SOLN
Status: DC | PRN
  Administered 2018-01-28: 1000 mL

## 2018-01-28 MED ORDER — ONDANSETRON HCL 4 MG/2ML IJ SOLN
INTRAMUSCULAR | Status: AC
  Filled 2018-01-28: qty 6

## 2018-01-28 MED ORDER — FENTANYL CITRATE (PF) 250 MCG/5ML IJ SOLN
INTRAMUSCULAR | Status: AC
  Filled 2018-01-28: qty 5

## 2018-01-28 MED ORDER — ACETAMINOPHEN 325 MG PO TABS: 325.0000 mg | ORAL_TABLET | ORAL | Status: DC | PRN

## 2018-01-28 MED ORDER — NEOSTIGMINE METHYLSULFATE 5 MG/5ML IV SOSY
PREFILLED_SYRINGE | INTRAVENOUS | Status: AC
  Filled 2018-01-28: qty 5

## 2018-01-28 MED ORDER — DEXAMETHASONE SODIUM PHOSPHATE 10 MG/ML IJ SOLN
INTRAMUSCULAR | Status: AC
  Filled 2018-01-28: qty 3

## 2018-01-28 MED ORDER — MIDAZOLAM HCL 5 MG/5ML IJ SOLN
INTRAMUSCULAR | Status: DC | PRN
  Administered 2018-01-28: 2 mg via INTRAVENOUS

## 2018-01-28 MED ORDER — ROCURONIUM BROMIDE 10 MG/ML (PF) SYRINGE
PREFILLED_SYRINGE | INTRAVENOUS | Status: AC
  Filled 2018-01-28: qty 15

## 2018-01-28 MED ORDER — SUGAMMADEX SODIUM 200 MG/2ML IV SOLN
INTRAVENOUS | Status: DC | PRN
  Administered 2018-01-28: 150 mg via INTRAVENOUS

## 2018-01-28 MED ORDER — ROCURONIUM BROMIDE 10 MG/ML (PF) SYRINGE
PREFILLED_SYRINGE | INTRAVENOUS | Status: AC
  Filled 2018-01-28: qty 5

## 2018-01-28 MED ORDER — ONDANSETRON HCL 4 MG/2ML IJ SOLN
INTRAMUSCULAR | Status: DC | PRN
  Administered 2018-01-28: 4 mg via INTRAVENOUS

## 2018-01-28 MED ORDER — ROCURONIUM BROMIDE 10 MG/ML (PF) SYRINGE
PREFILLED_SYRINGE | INTRAVENOUS | Status: DC | PRN
  Administered 2018-01-28: 15 mg via INTRAVENOUS
  Administered 2018-01-28: 5 mg via INTRAVENOUS
  Administered 2018-01-28: 40 mg via INTRAVENOUS

## 2018-01-28 MED ORDER — BUPIVACAINE-EPINEPHRINE 0.25% -1:200000 IJ SOLN
INTRAMUSCULAR | Status: DC | PRN
  Administered 2018-01-28: 10 mL

## 2018-01-28 MED ORDER — FENTANYL CITRATE (PF) 100 MCG/2ML IJ SOLN: 25.0000 ug | INTRAMUSCULAR | Status: DC | PRN

## 2018-01-28 MED ORDER — SUCCINYLCHOLINE CHLORIDE 200 MG/10ML IV SOSY
PREFILLED_SYRINGE | INTRAVENOUS | Status: DC | PRN
  Administered 2018-01-28: 120 mg via INTRAVENOUS

## 2018-01-28 MED ORDER — SODIUM CHLORIDE 0.9 % IR SOLN
Status: DC | PRN
  Administered 2018-01-28: 1000 mL

## 2018-01-28 MED ORDER — PROPOFOL 10 MG/ML IV BOLUS
INTRAVENOUS | Status: AC
  Filled 2018-01-28: qty 20

## 2018-01-28 MED ORDER — 0.9 % SODIUM CHLORIDE (POUR BTL) OPTIME
TOPICAL | Status: DC | PRN
  Administered 2018-01-28: 1000 mL

## 2018-01-28 MED ORDER — OXYCODONE HCL 5 MG PO TABS: 5.0000 mg | ORAL_TABLET | Freq: Once | ORAL | Status: DC | PRN

## 2018-01-28 MED ORDER — IOPAMIDOL (ISOVUE-300) INJECTION 61%
INTRAVENOUS | Status: AC
  Filled 2018-01-28: qty 50

## 2018-01-28 MED ORDER — OXYCODONE HCL 5 MG PO TABS
5.0000 mg | ORAL_TABLET | Freq: Once | ORAL | Status: AC | PRN
  Administered 2018-01-28: 5 mg via ORAL

## 2018-01-28 MED ORDER — FENTANYL CITRATE (PF) 250 MCG/5ML IJ SOLN
INTRAMUSCULAR | Status: DC | PRN
  Administered 2018-01-28: 50 ug via INTRAVENOUS
  Administered 2018-01-28: 100 ug via INTRAVENOUS

## 2018-01-28 MED ORDER — PROPOFOL 10 MG/ML IV BOLUS
INTRAVENOUS | Status: DC | PRN
  Administered 2018-01-28: 150 mg via INTRAVENOUS

## 2018-01-28 MED ORDER — DEXAMETHASONE SODIUM PHOSPHATE 10 MG/ML IJ SOLN
INTRAMUSCULAR | Status: DC | PRN
  Administered 2018-01-28: 10 mg via INTRAVENOUS

## 2018-01-28 MED ORDER — OXYCODONE HCL 5 MG PO TABS
ORAL_TABLET | ORAL | Status: AC
  Filled 2018-01-28: qty 1

## 2018-01-28 MED ORDER — OXYCODONE HCL 5 MG/5ML PO SOLN: 5.0000 mg | Freq: Once | ORAL | Status: AC | PRN

## 2018-01-28 MED ORDER — ACETAMINOPHEN 160 MG/5ML PO SOLN: 325.0000 mg | ORAL | Status: DC | PRN

## 2018-01-28 MED ORDER — FENTANYL CITRATE (PF) 100 MCG/2ML IJ SOLN
INTRAMUSCULAR | Status: AC
  Filled 2018-01-28: qty 2

## 2018-01-28 MED ORDER — BUPIVACAINE-EPINEPHRINE (PF) 0.25% -1:200000 IJ SOLN
INTRAMUSCULAR | Status: AC
  Filled 2018-01-28: qty 30

## 2018-01-28 MED ORDER — MIDAZOLAM HCL 2 MG/2ML IJ SOLN
INTRAMUSCULAR | Status: AC
  Filled 2018-01-28: qty 2

## 2018-01-28 SURGICAL SUPPLY — 48 items
APPLIER CLIP 5 13 M/L LIGAMAX5 (MISCELLANEOUS) ×2
BIOPATCH BLUE 3/4IN DISK W/1.5 (GAUZE/BANDAGES/DRESSINGS) ×2 IMPLANT
CANISTER SUCT 3000ML PPV (MISCELLANEOUS) ×2 IMPLANT
CHLORAPREP W/TINT 26ML (MISCELLANEOUS) ×2 IMPLANT
CLIP APPLIE 5 13 M/L LIGAMAX5 (MISCELLANEOUS) ×1 IMPLANT
COVER MAYO STAND STRL (DRAPES) ×2 IMPLANT
COVER SURGICAL LIGHT HANDLE (MISCELLANEOUS) ×2 IMPLANT
CUTTER FLEX LINEAR 45M (STAPLE) ×2 IMPLANT
DERMABOND ADVANCED (GAUZE/BANDAGES/DRESSINGS) ×1
DERMABOND ADVANCED .7 DNX12 (GAUZE/BANDAGES/DRESSINGS) ×1 IMPLANT
DRAIN CHANNEL 19F RND (DRAIN) ×2 IMPLANT
DRSG TEGADERM 4X4.75 (GAUZE/BANDAGES/DRESSINGS) ×2 IMPLANT
ELECT REM PT RETURN 9FT ADLT (ELECTROSURGICAL) ×2
ELECTRODE REM PT RTRN 9FT ADLT (ELECTROSURGICAL) ×1 IMPLANT
EVACUATOR SILICONE 100CC (DRAIN) ×2 IMPLANT
GLOVE BIO SURGEON STRL SZ 6 (GLOVE) ×2 IMPLANT
GLOVE BIOGEL PI IND STRL 6.5 (GLOVE) ×2 IMPLANT
GLOVE BIOGEL PI IND STRL 7.0 (GLOVE) ×1 IMPLANT
GLOVE BIOGEL PI IND STRL 8 (GLOVE) ×1 IMPLANT
GLOVE BIOGEL PI INDICATOR 6.5 (GLOVE) ×2
GLOVE BIOGEL PI INDICATOR 7.0 (GLOVE) ×1
GLOVE BIOGEL PI INDICATOR 8 (GLOVE) ×1
GLOVE SS BIOGEL STRL SZ 7.5 (GLOVE) ×1 IMPLANT
GLOVE SUPERSENSE BIOGEL SZ 7.5 (GLOVE) ×1
GLOVE SURG SS PI 8.0 STRL IVOR (GLOVE) ×2 IMPLANT
GOWN STRL REUS W/ TWL LRG LVL3 (GOWN DISPOSABLE) ×4 IMPLANT
GOWN STRL REUS W/TWL LRG LVL3 (GOWN DISPOSABLE) ×4
GRASPER SUT TROCAR 14GX15 (MISCELLANEOUS) ×2 IMPLANT
KIT BASIN OR (CUSTOM PROCEDURE TRAY) ×2 IMPLANT
KIT TURNOVER KIT B (KITS) ×2 IMPLANT
NEEDLE INSUFFLATION 14GA 120MM (NEEDLE) ×2 IMPLANT
NS IRRIG 1000ML POUR BTL (IV SOLUTION) ×2 IMPLANT
PAD ARMBOARD 7.5X6 YLW CONV (MISCELLANEOUS) ×2 IMPLANT
POUCH SPECIMEN RETRIEVAL 10MM (ENDOMECHANICALS) ×2 IMPLANT
RELOAD STAPLE TA45 3.5 REG BLU (ENDOMECHANICALS) ×2 IMPLANT
SCISSORS LAP 5X35 DISP (ENDOMECHANICALS) ×2 IMPLANT
SET IRRIG TUBING LAPAROSCOPIC (IRRIGATION / IRRIGATOR) ×2 IMPLANT
SLEEVE ENDOPATH XCEL 5M (ENDOMECHANICALS) ×2 IMPLANT
SPECIMEN JAR SMALL (MISCELLANEOUS) ×2 IMPLANT
SUT ETHILON 2 0 FS 18 (SUTURE) ×2 IMPLANT
SUT MNCRL AB 4-0 PS2 18 (SUTURE) ×2 IMPLANT
TOWEL OR 17X24 6PK STRL BLUE (TOWEL DISPOSABLE) ×2 IMPLANT
TRAY LAPAROSCOPIC MC (CUSTOM PROCEDURE TRAY) ×2 IMPLANT
TROCAR BLADELESS 12MM (ENDOMECHANICALS) ×2 IMPLANT
TROCAR XCEL NON-BLD 11X100MML (ENDOMECHANICALS) ×2 IMPLANT
TROCAR XCEL NON-BLD 5MMX100MML (ENDOMECHANICALS) ×2 IMPLANT
TUBING INSUFFLATION (TUBING) ×2 IMPLANT
WATER STERILE IRR 1000ML POUR (IV SOLUTION) ×2 IMPLANT

## 2018-01-28 NOTE — Anesthesia Procedure Notes (Addendum)
Procedure Name: Intubation Date/Time: 01/28/2018 11:52 AM Performed by: Imagene Riches, CRNA Pre-anesthesia Checklist: Patient identified, Emergency Drugs available, Suction available and Patient being monitored Patient Re-evaluated:Patient Re-evaluated prior to induction Oxygen Delivery Method: Circle System Utilized Preoxygenation: Pre-oxygenation with 100% oxygen Induction Type: IV induction, Cricoid Pressure applied and Rapid sequence Ventilation: Mask ventilation without difficulty Laryngoscope Size: Mac and 3 Grade View: Grade I Tube type: Oral Tube size: 7.0 mm Number of attempts: 1 Airway Equipment and Method: Stylet and Oral airway Placement Confirmation: ETT inserted through vocal cords under direct vision,  positive ETCO2 and breath sounds checked- equal and bilateral Secured at: 23 cm Tube secured with: Tape Dental Injury: Teeth and Oropharynx as per pre-operative assessment

## 2018-01-28 NOTE — Anesthesia Preprocedure Evaluation (Addendum)
Anesthesia Evaluation  Patient identified by MRN, date of birth, ID band Patient awake    Reviewed: Allergy & Precautions, H&P , NPO status , Patient's Chart, lab work & pertinent test results, reviewed documented beta blocker date and time   Airway Mallampati: III  TM Distance: >3 FB Neck ROM: full    Dental no notable dental hx. (+) Chipped, Poor Dentition, Loose,    Pulmonary neg pulmonary ROS,    Pulmonary exam normal breath sounds clear to auscultation       Cardiovascular Exercise Tolerance: Good negative cardio ROS   Rhythm:regular Rate:Normal     Neuro/Psych negative neurological ROS  negative psych ROS   GI/Hepatic negative GI ROS, Neg liver ROS,   Endo/Other  diabetes, Poorly Controlled, Type 2Hypothyroidism   Renal/GU negative Renal ROS  negative genitourinary   Musculoskeletal   Abdominal   Peds  Hematology negative hematology ROS (+)   Anesthesia Other Findings   Reproductive/Obstetrics negative OB ROS                           Anesthesia Physical Anesthesia Plan  ASA: II  Anesthesia Plan: General   Post-op Pain Management:    Induction: Intravenous  PONV Risk Score and Plan: 2 and Ondansetron, Dexamethasone and Treatment may vary due to age or medical condition  Airway Management Planned: Oral ETT  Additional Equipment:   Intra-op Plan:   Post-operative Plan: Extubation in OR  Informed Consent: I have reviewed the patients History and Physical, chart, labs and discussed the procedure including the risks, benefits and alternatives for the proposed anesthesia with the patient or authorized representative who has indicated his/her understanding and acceptance.   Dental Advisory Given  Plan Discussed with: CRNA, Anesthesiologist and Surgeon  Anesthesia Plan Comments: (  )        Anesthesia Quick Evaluation

## 2018-01-28 NOTE — Op Note (Signed)
Operative Note  Raymond Fitzgerald 47 y.o. male [id number]  01/28/2018  Surgeon: Berna Buehelsea A Jeremiah Curci MD  Assistant: Wells GuilesKelly Rayburn PA-C  Procedure performed: Laparoscopic Cholecystectomy  Preop diagnosis: acute cholecystitis  Post-op diagnosis/intraop findings: gangrenous cholecystitis  Retained items: 19 fr round blake drain  Specimens: gallbladder  EBL: 50cc  Complications: none  Description of procedure: After obtaining informed consent the patient was brought to the operating room. Prophylactic antibiotics and subcutaneous heparin were administered. SCD's were applied. General endotracheal anesthesia was initiated and a formal time-out was performed. The abdomen was prepped and draped in the usual sterile fashion and the abdomen was entered using an infraumbilical veress needle after instilling the site with local. Insufflation to 15mmHg was obtained, 5mm trocar and camera placed and gross inspection revealed no evidence of injury from our entry or other intraabdominal abnormalities. The gallbladder was encased in dense omental adhesions. Two 5mm trocars were introduced in the right midclavicular and right anterior axillary lines under direct visualization and following infiltration with local. An 11mm trocar was placed in the epigastrium. Gentle blunt dissection was used to sweep away the dense case of omental adhesions around the gallbladder. It was revealed the gallbladder was gangrenous and severely inflamed. It was decompressed with an nezhat aspirator.Continued blunt dissection and careful cautery was used to expose the infundibulum. The gallbladder was retracted cephalad and the infundibulum was retracted laterally. A combination of hook electrocautery and blunt dissection was utilized to clear the peritoneum from the neck and cystic duct, circumferentially isolating the cystic artery and cystic duct and lifting the gallbladder from the cystic plate. This was especially tedious and  difficult due to the dense inflammatory and fibrotic tissue in this area. It appeared that the cystic duct was very short and partially tethered to the common duct.The critical view of safety was achieved with the cystic artery, cystic duct, and liver bed visualized between them with no other structures. The artery was diminutive and was divided with cautery. The gallbladder was completely dissected away from the liver bed such that it was dangling by the cystic duct.  A blue load linear cutting stapler was then used to transect the cystic duct at its junction with the gallbladder neck ensuring no impingement or other entrapped structures. The gallbladder was placed in an Endo Catch bag and removed through our epigastric trocar site. The staple line was inspected and found to be well opposed with no leakage of bile. A small amount of bleeding on the liver bed was controlled with cautery. Some bile had been spilled from the gallbladder during its dissection from the liver bed. This was aspirated and the right upper quadrant was irrigated copiously until the effluent was clear. Hemostasis was once again confirmed, and reinspection of the abdomen revealed no injuries. The staple line remained intact on what appeared to be viable tissue. Surgicel Jamelle HaringSnow was placed in the liver bed and then a 19 [place] round Blake drain was placed in the liver bed exiting the right lateral abdomen. This was secured to skin with a 3-0 nylon. The 11mm trocar site in the epigastrium was closed with a 0 vicryl in the fascia under direct visualization using a PMI device. The abdomen was desufflated and all trocars removed. The skin incisions were closed with running subcuticular monocryl and Dermabond. The patient was awakened, extubated and transported to the recovery room in stable condition.   All counts were correct at the completion of the case.

## 2018-01-28 NOTE — Progress Notes (Signed)
[facility] Progress Note     Subjective: CC: abdominal pain Patient states abdominal pain is intermittent and unchanged from yesterday. Reports some nausea currently. Passing flatus. No questions about surgery today. UOP good. Tmax 100.3.   Objective: Vital signs in last 24 hours: Temp:  [99.3 F (37.4 C)-100.3 F (37.9 C)] 100.3 F (37.9 C) (04/24 0448) Pulse Rate:  [105-113] 105 (04/24 0448) Resp:  [8-18] 8 (04/23 2213) BP: (112-135)/(70-82) 135/82 (04/24 0448) SpO2:  [94 %-98 %] 96 % (04/24 0448) Weight:  [77 kg (169 lb 12.1 oz)] 77 kg (169 lb 12.1 oz) (04/23 2213) Last BM Date: 01/26/18  Intake/Output from previous day: 04/23 0701 - 04/24 0700 In: 3370 [P.O.:120; I.V.:3150; IV Piggyback:100] Out: 3025 [Urine:3025] Intake/Output this shift: No intake/output data recorded.  PE: Gen:  Alert, NAD, pleasant Card:  Regular rate and rhythm, pedal pulses 2+ BL Pulm:  Normal effort, clear to auscultation bilaterally Abd: Soft, TTP in RUQ, non-distended, bowel sounds present in all 4 quadrants, no HSM Skin: warm and dry, no rashes  Psych: A&Ox3   Lab Results:  Recent Labs    01/26/18 1605 01/28/18 0524  WBC 25.1* 16.0*  HGB 16.9 13.5  HCT 48.5 39.7  PLT 209 171   BMET Recent Labs    01/27/18 0205 01/28/18 0524  NA 133* 136  K 3.8 3.7  CL 103 104  CO2 18* 22  GLUCOSE 144* 107*  BUN 12 7  CREATININE 0.75 0.80  CALCIUM 7.8* 8.0*   PT/INR No results for input(s): LABPROT, INR in the last 72 hours. CMP     Component Value Date/Time   NA 136 01/28/2018 0524   K 3.7 01/28/2018 0524   CL 104 01/28/2018 0524   CO2 22 01/28/2018 0524   GLUCOSE 107 (H) 01/28/2018 0524   BUN 7 01/28/2018 0524   CREATININE 0.80 01/28/2018 0524   CREATININE 0.90 06/04/2017 0837   CALCIUM 8.0 (L) 01/28/2018 0524   PROT 6.1 (L) 01/28/2018 0524   ALBUMIN 2.8 (L) 01/28/2018 0524   AST 20 01/28/2018 0524   ALT 28 01/28/2018 0524   ALKPHOS 67 01/28/2018 0524   BILITOT 1.8 (H) 01/28/2018 0524   GFRNONAA >60 01/28/2018 0524   GFRNONAA >89 06/04/2017 0837   GFRAA >60 01/28/2018 0524   GFRAA >89 06/04/2017 0837   Lipase     Component Value Date/Time   LIPASE 23 01/26/2018 1605       Studies/Results: Nm Hepatobiliary Liver Func  Result Date: 01/27/2018 CLINICAL DATA:  47 year old male with right upper quadrant pain, nausea and vomiting. Gallstones. Subsequent encounter. EXAM: NUCLEAR MEDICINE HEPATOBILIARY IMAGING TECHNIQUE: Sequential images of the abdomen were obtained out to 60 minutes following intravenous administration of radiopharmaceutical. 3 mg of morphine administered an additional 60 minutes of images obtained RADIOPHARMACEUTICALS:  5.1 mCi Tc-3m  Choletec IV COMPARISON:  01/27/2018 ultrasound and CT. FINDINGS: Prompt uptake and biliary excretion of activity by the liver is seen. Biliary activity passes into small bowel, consistent with patent common bile duct. Gallbladder not visualized on the first hour. 3 mg of morphine administered and additional hour of imaging performed and gallbladder not visualized. IMPRESSION: Gallbladder not visualized despite administration of morphine. Cystic duct therefore may be obstructed. In the proper clinical setting, findings may reflect changes of acute cholecystitis. These results will be called to the ordering clinician or representative by the Radiologist Assistant, and communication documented in the PACS or zVision Dashboard. Electronically Signed   By: Ernie Hew.D.  On: 01/27/2018 14:15   [place] Abdomen Complete  Result Date: 01/27/2018 CLINICAL DATA:  Right upper quadrant and epigastric pain with vomiting x1 day. EXAM: ABDOMEN ULTRASOUND COMPLETE COMPARISON:  Same day CT FINDINGS: Gallbladder: Intraluminal shadowing 6 mm calculus noted within the gallbladder neck. No sonographic Murphy sign noted by sonographer gallbladder slightly distended up to 5 mm in thickness. Common bile duct: Diameter:  Normal at 5.7 mm Liver: No focal lesion identified. Hepatic steatosis with focal fatty sparing. Portal vein is patent on color Doppler imaging with normal direction of blood flow towards the liver. IVC: No abnormality visualized. Pancreas: Visualized portion unremarkable. Most of the gland was obscured by overlying bowel. Spleen: Size and appearance within normal limits. Right Kidney: Length: 10.9 cm. Echogenicity within normal limits. No mass or hydronephrosis visualized. Left Kidney: Length: 11.9 cm. Echogenicity within normal limits. No mass or hydronephrosis visualized. Abdominal aorta: No aneurysm visualized. Other findings: None. IMPRESSION: 1. Fatty liver with focal fatty sparing. 2. Mild thickening of the gallbladder wall up to 5 mm in single wall thickness (normal 3 mm or less) with distension similar to CT findings. A 6 mm calculus is noted near the neck of the gallbladder. Other than the gallbladder wall thickening, calculus and mild distension, no additional signs supporting acute cholecystitis are identified. Electronically Signed   By: Tollie Ethavid  Kwon M.D.   On: 01/27/2018 03:48   Ct Abdomen Pelvis W Contrast  Result Date: 01/27/2018 CLINICAL DATA:  Nausea, vomiting and abdominal pain. EXAM: CT ABDOMEN AND PELVIS WITH CONTRAST TECHNIQUE: Multidetector CT imaging of the abdomen and pelvis was performed using the standard protocol following bolus administration of intravenous contrast. CONTRAST:  100mL ISOVUE-300 IOPAMIDOL (ISOVUE-300) INJECTION 61% COMPARISON:  None. FINDINGS: Lower chest: Normal size heart. No pericardial effusion. Bibasilar atelectasis. No pneumothorax or pleural effusion. Hepatobiliary: The gallbladder appears distended without wall thickening. There is a 4 mm calculus without obstruction noted. Right upper quadrant mesenteric edema and pericholecystic edema is seen. It is uncertain whether this is secondary to a cholecystitis versus distal gastritis/peptic ulcer disease, less likely  duodenitis given lack of duodenal thickening. Pancreas: The pancreas appears unremarkable without ductal dilatation. Spleen: Normal in size without focal abnormality. Adrenals/Urinary Tract: Normal bilateral adrenal glands. Symmetric enhancement of both kidneys. No nephrolithiasis nor obstructive uropathy. No hydroureteronephrosis. The bladder is distended without focal mural thickening or calculi. Stomach/Bowel: The gastric antrum and pyloric region appears somewhat thickened and edematous. Right upper quadrant mesenteric edema is also noted. Possibility of peptic ulcer disease and/or gastritis is raised. No bowel obstruction. Mild fluid-filled distention of proximal jejunum. Vascular/Lymphatic: No significant vascular findings are present. No enlarged abdominal or pelvic lymph nodes. Reproductive: Prostate is unremarkable. Other: Mild herniation of properitoneal fat within the proximal inguinal canals bilaterally. Musculoskeletal: No acute or significant osseous findings. IMPRESSION: 1. The gallbladder is distended and contains a 4 mm calculus. There is mild soft tissue edema in the right upper quadrant. Differential possibilities may include changes from acute cholecystitis, peptic ulcer disease/gastritis, less likely duodenitis. A HIDA scan may help for further correlation. 2. Mild fluid-filled distention of proximal jejunal loops may also represent sympathetic distention/ileus versus mild small bowel enteritis. No mechanical bowel obstruction. Electronically Signed   By: Tollie Ethavid  Kwon M.D.   On: 01/27/2018 01:05    Anti-infectives: Anti-infectives (From admission, onward)   Start     Dose/Rate Route Frequency Ordered Stop   01/27/18 1800  ciprofloxacin (CIPRO) IVPB 400 mg     400 mg 200 mL/hr over  60 Minutes Intravenous Every 12 hours 01/27/18 0813     01/27/18 1400  metroNIDAZOLE (FLAGYL) IVPB 500 mg     500 mg 100 mL/hr over 60 Minutes Intravenous Every 8 hours 01/27/18 0813     01/27/18 0300   ciprofloxacin (CIPRO) IVPB 400 mg     400 mg 200 mL/hr over 60 Minutes Intravenous  Once 01/27/18 0248 01/27/18 0438   01/27/18 0300  metroNIDAZOLE (FLAGYL) IVPB 500 mg     500 mg 100 mL/hr over 60 Minutes Intravenous  Once 01/27/18 0248 01/27/18 0443       Assessment/Plan HLD DM on insulin Hypothyroidism  Sepsis Acute Cholecystitis - 6mm stone near neck of gallbadder seen on [place], mild wall thickening 5mm - CT showed Mild fluid-filled distention of proximal jejunal loops may also represent sympathetic distention/ileus versus mild small bowel enteritis. No mechanical bowel obstruction. - HIDA positive for acute cholecystitis - WBC 16 from 25, Tmax 100.3 - Tbili 1.8 from 1.9, AST and ALT both WNL - to OR later today for laparoscopic cholecystectomy  FEN: NPO, IVF VTE: SCDs ID: cipro/flagyl 4/23>>    LOS: 1 day    Wells Guiles , [facility] 01/28/2018, 8:26 AM Pager: [phone] Consults: [phone] Mon-Fri 7:00 am-4:30 pm Sat-Sun 7:00 am-11:30 am

## 2018-01-28 NOTE — Transfer of Care (Signed)
Immediate Anesthesia Transfer of Care Note  Patient: Raymond Fitzgerald  Procedure(s) Performed: LAPAROSCOPIC CHOLECYSTECTOMY (N/A Abdomen)  Patient Location: PACU  Anesthesia Type:General  Level of Consciousness: drowsy  Airway & Oxygen Therapy: Patient Spontanous Breathing and Patient connected to nasal cannula oxygen  Post-op Assessment: Report given to RN and Post -op Vital signs reviewed and stable  Post vital signs: Reviewed and stable  Last Vitals:  Vitals Value Taken Time  BP 152/96 01/28/2018  1:53 PM  Temp    Pulse 101 01/28/2018  1:56 PM  Resp 18 01/28/2018  1:56 PM  SpO2 98 % 01/28/2018  1:56 PM  Vitals shown include unvalidated device data.  Last Pain:  Vitals:   01/28/18 0950  TempSrc: Oral  PainSc:       Patients Stated Pain Goal: 0 (01/27/18 1956)  Complications: No apparent anesthesia complications

## 2018-01-28 NOTE — Progress Notes (Signed)
Patient Demographics:    Raymond Fitzgerald, is a 47 y.o. male, DOB - [birth year], ZOX:[id number]  Admit date - 01/26/2018   Admitting Physician Briscoe Deutscher, MD  Outpatient Primary MD for the patient is Patient, No Pcp Per  LOS - 1   Chief Complaint  Patient presents with  . Abdominal Pain        Subjective:    [name] today has no fevers, no further emesis,  No chest pain,  12 yr son at bedside   Assessment  & Plan :    Principal Problem:   Sepsis (HCC) Active Problems:   Hyperlipidemia   Uncontrolled diabetes mellitus type 2 without complications (HCC)   Hypothyroidism   Acute cholecystitis  47 y.o. male with medical history significant of IDDM, hypothyroidism, and HLD    1)Acute calculus cholecystitis- lap chole on 01/28/18, postop care as per surgical team, was treated with Cipro and Flagyl, continue as needed Zofran and opiates  2)DM- Allow some permissive Hyperglycemia rather than risk life-threatening hypoglycemia in a patient with unreliable oral intake in the postop patient. Use Novolog/Humalog Sliding scale insulin with Accu-Cheks/Fingersticks as ordered, hold metformin and Invokana  3)Hypothyroidism-TSH is 4.3 restart levothyroxine 50 mcg daily  Code Status : Full  Disposition Plan  : home   Consults  :  gen surg  DVT Prophylaxis  :  Lovenox    Lab Results  Component Value Date   PLT 171 01/28/2018    Inpatient Medications  Scheduled Meds: . [MAR Hold] atorvastatin  20 mg Oral q1800  . [START ON 01/29/2018] enoxaparin (LOVENOX) injection  40 mg Subcutaneous Q24H  . fentaNYL      . [MAR Hold] insulin aspart  0-15 Units Subcutaneous TID WC  . [MAR Hold] insulin aspart  0-5 Units Subcutaneous QHS  . [MAR Hold] insulin detemir  25 Units Subcutaneous Q2200  . [MAR Hold] levothyroxine  50 mcg Oral QAC breakfast  . oxyCODONE       Continuous Infusions: . [MAR  Hold] ciprofloxacin Stopped (01/28/18 0734)  . lactated ringers 50 mL/hr at 01/28/18 1125  . [MAR Hold] metronidazole Stopped (01/28/18 0624)   PRN Meds:.acetaminophen **OR** acetaminophen (TYLENOL) oral liquid 160 mg/5 mL, [MAR Hold] acetaminophen **OR** [MAR Hold] acetaminophen, fentaNYL (SUBLIMAZE) injection, meperidine (DEMEROL) injection, [MAR Hold]  morphine injection, [MAR Hold] ondansetron **OR** [MAR Hold] ondansetron (ZOFRAN) IV, ondansetron (ZOFRAN) IV    Anti-infectives (From admission, onward)   Start     Dose/Rate Route Frequency Ordered Stop   01/27/18 1800  [MAR Hold]  ciprofloxacin (CIPRO) IVPB 400 mg     (MAR Hold since Wed 01/28/2018 at 1114. Reason: Transfer to a Procedural area.)   400 mg 200 mL/hr over 60 Minutes Intravenous Every 12 hours 01/27/18 0813     01/27/18 1400  [MAR Hold]  metroNIDAZOLE (FLAGYL) IVPB 500 mg     (MAR Hold since Wed 01/28/2018 at 1114. Reason: Transfer to a Procedural area.)   500 mg 100 mL/hr over 60 Minutes Intravenous Every 8 hours 01/27/18 0813     01/27/18 0300  ciprofloxacin (CIPRO) IVPB 400 mg     400 mg 200 mL/hr over 60 Minutes Intravenous  Once 01/27/18 0248 01/27/18 0438   01/27/18 0300  metroNIDAZOLE (  FLAGYL) IVPB 500 mg     500 mg 100 mL/hr over 60 Minutes Intravenous  Once 01/27/18 0248 01/27/18 0443        Objective:   Vitals:   01/28/18 1513 01/28/18 1523 01/28/18 1527 01/28/18 1538  BP:  (!) 152/91  133/85  Pulse: (!) 102 (!) 102 (!) 101 99  Resp: 19 13 15 12   Temp:    97.9 F (36.6 C)  TempSrc:      SpO2: 98% 97% 97% 97%  Weight:        Wt Readings from Last 3 Encounters:  01/27/18 77 kg (169 lb 12.1 oz)  01/02/18 73.1 kg (161 lb 3.2 oz)  09/04/17 73.4 kg (161 lb 12.8 oz)     Intake/Output Summary (Last 24 hours) at 01/28/2018 1554 Last data filed at 01/28/2018 1525 Gross per 24 hour  Intake 3720 ml  Output 3345 ml  Net 375 ml     Physical Exam  Gen:- Awake Alert,  In no apparent distress  HEENT:-  [place].AT, No sclera icterus Neck-Supple Neck,No JVD,.  Lungs-  CTAB , good air movement CV- S1, S2 normal Abd-  +ve B.Sounds, epigastric and right upper quadrant tenderness,    Extremity/Skin:- No  edema,   good pulses Psych-affect is appropriate, oriented x3 Neuro-no new focal deficits, no tremors   Data Review:   Micro Results Recent Results (from the past 240 hour(s))  Culture, blood (Routine X 2) w Reflex to ID Panel     Status: None (Preliminary result)   Collection Time: 01/27/18 12:51 AM  Result Value Ref Range Status   Specimen Description BLOOD LEFT ANTECUBITAL  Final   Special Requests   Final    BOTTLES DRAWN AEROBIC AND ANAEROBIC Blood Culture adequate volume   Culture   Final    NO GROWTH 1 DAY Performed at [facility], [address], [place], [place] [address]    Report Status PENDING  Incomplete  Culture, blood (Routine X 2) w Reflex to ID Panel     Status: None (Preliminary result)   Collection Time: 01/27/18  2:00 AM  Result Value Ref Range Status   Specimen Description BLOOD RIGHT HAND  Final   Special Requests   Final    BOTTLES DRAWN AEROBIC AND ANAEROBIC Blood Culture adequate volume   Culture   Final    NO GROWTH 1 DAY Performed at [facility], [address], [place], [place] [address]    Report Status PENDING  Incomplete  Surgical pcr screen     Status: None   Collection Time: 01/27/18  7:39 PM  Result Value Ref Range Status   MRSA, PCR NEGATIVE NEGATIVE Final   Staphylococcus aureus NEGATIVE NEGATIVE Final    Comment: (NOTE) The Xpert SA Assay (FDA approved for NASAL specimens in patients 76 years of age and older), is one component of a comprehensive surveillance program. It is not intended to diagnose infection nor to guide or monitor treatment. Performed at [facility], [address], [place], [place] [address]     Radiology Reports Nm Hepatobiliary Liver Func  Result Date: 01/27/2018 CLINICAL DATA:  47 year old male  with right upper quadrant pain, nausea and vomiting. Gallstones. Subsequent encounter. EXAM: NUCLEAR MEDICINE HEPATOBILIARY IMAGING TECHNIQUE: Sequential images of the abdomen were obtained out to 60 minutes following intravenous administration of radiopharmaceutical. 3 mg of morphine administered an additional 60 minutes of images obtained RADIOPHARMACEUTICALS:  5.1 mCi Tc-73m  Choletec IV COMPARISON:  01/27/2018 ultrasound and CT. FINDINGS: Prompt uptake and biliary excretion of activity by the liver is seen. Biliary activity passes into small bowel, consistent with patent common bile duct. Gallbladder not visualized on the first hour. 3 mg of morphine administered and additional hour of imaging performed and gallbladder not visualized. IMPRESSION: Gallbladder not visualized despite administration of morphine. Cystic duct therefore may be obstructed. In the proper clinical setting, findings may reflect changes of acute cholecystitis. These results will be called to the ordering clinician or representative by the Radiologist Assistant, and communication documented in the PACS or zVision Dashboard. Electronically Signed   By: Lacy DuverneySteven  Olson M.D.   On: 01/27/2018 14:15   [place] Abdomen Complete  Result Date: 01/27/2018 CLINICAL DATA:  Right upper quadrant and epigastric pain with vomiting x1 day. EXAM: ABDOMEN ULTRASOUND COMPLETE COMPARISON:  Same day CT FINDINGS: Gallbladder: Intraluminal shadowing 6 mm calculus noted within the gallbladder neck. No sonographic Murphy sign noted by sonographer gallbladder slightly distended up to 5 mm in thickness. Common bile duct: Diameter: Normal at 5.7 mm Liver: No focal lesion identified. Hepatic steatosis with focal fatty sparing. Portal vein is patent on color Doppler imaging with normal direction of blood flow towards the liver. IVC: No abnormality visualized. Pancreas: Visualized portion unremarkable. Most of the gland was obscured by overlying bowel. Spleen: Size and  appearance within normal limits. Right Kidney: Length: 10.9 cm. Echogenicity within normal limits. No mass or hydronephrosis visualized. Left Kidney: Length: 11.9 cm. Echogenicity within normal limits. No mass or hydronephrosis visualized. Abdominal aorta: No aneurysm visualized. Other findings: None. IMPRESSION: 1. Fatty liver with focal fatty sparing. 2. Mild thickening of the gallbladder wall up to 5 mm in single wall thickness (normal 3 mm or less) with distension similar to CT findings. A 6 mm calculus is noted near the neck of the gallbladder. Other than the gallbladder wall thickening, calculus and mild distension, no additional signs supporting acute cholecystitis are identified. Electronically Signed   By: Tollie Ethavid  Kwon M.D.   On: 01/27/2018 03:48   Ct Abdomen Pelvis W Contrast  Result Date: 01/27/2018 CLINICAL DATA:  Nausea, vomiting and abdominal pain. EXAM: CT ABDOMEN AND PELVIS WITH CONTRAST TECHNIQUE: Multidetector CT imaging of the abdomen and pelvis was performed using the standard protocol following bolus administration of intravenous contrast. CONTRAST:  100mL ISOVUE-300 IOPAMIDOL (ISOVUE-300) INJECTION 61% COMPARISON:  None. FINDINGS: Lower chest: Normal size heart. No pericardial effusion. Bibasilar atelectasis. No pneumothorax or pleural effusion. Hepatobiliary: The gallbladder appears distended without wall thickening. There is a 4 mm calculus without obstruction noted. Right upper quadrant mesenteric edema and pericholecystic edema is seen. It is uncertain whether this is secondary to a cholecystitis versus distal gastritis/peptic ulcer disease, less likely duodenitis given lack of duodenal thickening. Pancreas: The pancreas appears unremarkable without ductal dilatation. Spleen: Normal in size without focal abnormality. Adrenals/Urinary Tract: Normal bilateral adrenal glands. Symmetric enhancement of both kidneys. No nephrolithiasis nor obstructive uropathy. No hydroureteronephrosis. The  bladder is distended without focal mural thickening or calculi. Stomach/Bowel: The gastric antrum and pyloric region appears somewhat thickened and edematous. Right upper quadrant mesenteric edema is also noted. Possibility of peptic ulcer disease and/or gastritis is raised. No bowel obstruction. Mild fluid-filled distention of proximal jejunum. Vascular/Lymphatic: No significant vascular findings are present. No enlarged abdominal or pelvic lymph nodes. Reproductive: Prostate is unremarkable. Other: Mild herniation of properitoneal fat within the proximal inguinal canals bilaterally. Musculoskeletal: No acute or significant osseous findings. IMPRESSION: 1. The gallbladder is distended and contains  a 4 mm calculus. There is mild soft tissue edema in the right upper quadrant. Differential possibilities may include changes from acute cholecystitis, peptic ulcer disease/gastritis, less likely duodenitis. A HIDA scan may help for further correlation. 2. Mild fluid-filled distention of proximal jejunal loops may also represent sympathetic distention/ileus versus mild small bowel enteritis. No mechanical bowel obstruction. Electronically Signed   By: Tollie Eth M.D.   On: 01/27/2018 01:05     CBC Recent Labs  Lab 01/26/18 1605 01/28/18 0524  WBC 25.1* 16.0*  HGB 16.9 13.5  HCT 48.5 39.7  PLT 209 171  MCV 81.0 80.9  MCH 28.2 27.5  MCHC 34.8 34.0  RDW 13.3 12.8    Chemistries  Recent Labs  Lab 01/26/18 1605 01/27/18 0205 01/28/18 0524  NA 137 133* 136  K 4.4 3.8 3.7  CL 102 103 104  CO2 18* 18* 22  GLUCOSE 168* 144* 107*  BUN 14 12 7   CREATININE 0.80 0.75 0.80  CALCIUM 9.3 7.8* 8.0*  AST 15 13* 20  ALT 21 16* 28  ALKPHOS 70 48 67  BILITOT 2.0* 1.9* 1.8*   ------------------------------------------------------------------------------------------------------------------ No results for input(s): CHOL, HDL, LDLCALC, TRIG, CHOLHDL, LDLDIRECT in the last 72 hours.  Lab Results  Component  Value Date   HGBA1C 6.9 01/02/2018   ------------------------------------------------------------------------------------------------------------------ Recent Labs    01/27/18 0857  TSH 4.622*   ------------------------------------------------------------------------------------------------------------------ No results for input(s): VITAMINB12, FOLATE, FERRITIN, TIBC, IRON, RETICCTPCT in the last 72 hours.  Coagulation profile No results for input(s): INR, PROTIME in the last 168 hours.  No results for input(s): DDIMER in the last 72 hours.  Cardiac Enzymes No results for input(s): CKMB, TROPONINI, MYOGLOBIN in the last 168 hours.  Invalid input(s): CK ------------------------------------------------------------------------------------------------------------------ No results found for: BNP   Shon Hale M.D on 01/28/2018 at 3:54 PM  Between 7am to 7pm - Pager - [phone]  After 7pm go to [email or web address] - password [email or web address]  [facility] -  Office  [phone]   Voice Recognition Reubin [place] dictation system was used to create this note, attempts have been made to correct errors. Please contact the author with questions and/or clarifications.

## 2018-01-29 ENCOUNTER — Encounter (HOSPITAL_COMMUNITY): Payer: Self-pay | Admitting: Surgery

## 2018-01-29 ENCOUNTER — Other Ambulatory Visit: Payer: Self-pay

## 2018-01-29 LAB — COMPREHENSIVE METABOLIC PANEL
ALBUMIN: 2.9 g/dL — AB (ref 3.5–5.0)
ALK PHOS: 90 U/L (ref 38–126)
ALT: 49 U/L (ref 17–63)
AST: 44 U/L — AB (ref 15–41)
Anion gap: 12 (ref 5–15)
BILIRUBIN TOTAL: 1 mg/dL (ref 0.3–1.2)
BUN: 11 mg/dL (ref 6–20)
CALCIUM: 8.3 mg/dL — AB (ref 8.9–10.3)
CO2: 23 mmol/L (ref 22–32)
CREATININE: 0.9 mg/dL (ref 0.61–1.24)
Chloride: 103 mmol/L (ref 101–111)
GFR calc Af Amer: >60 mL/min (ref >60)
GLUCOSE: 177 mg/dL — AB (ref 65–99)
POTASSIUM: 3.7 mmol/L (ref 3.5–5.1)
Sodium: 138 mmol/L (ref 135–145)
Total Protein: 6.3 g/dL — ABNORMAL LOW (ref 6.5–8.1)

## 2018-01-29 LAB — GLUCOSE, CAPILLARY
GLUCOSE-CAPILLARY: 176 mg/dL — AB (ref 65–99)
Glucose-Capillary: 187 mg/dL — ABNORMAL HIGH (ref 65–99)
Glucose-Capillary: 192 mg/dL — ABNORMAL HIGH (ref 65–99)
Glucose-Capillary: 210 mg/dL — ABNORMAL HIGH (ref 65–99)
Glucose-Capillary: 238 mg/dL — ABNORMAL HIGH (ref 65–99)

## 2018-01-29 LAB — CBC
HCT: 40.5 % (ref 39.0–52.0)
Hemoglobin: 13.6 g/dL (ref 13.0–17.0)
MCH: 27 pg (ref 26.0–34.0)
MCHC: 33.6 g/dL (ref 30.0–36.0)
MCV: 80.4 fL (ref 78.0–100.0)
PLATELETS: 236 10*3/uL (ref 150–400)
RBC: 5.04 MIL/uL (ref 4.22–5.81)
RDW: 12.9 % (ref 11.5–15.5)
WBC: 14.5 10*3/uL — AB (ref 4.0–10.5)

## 2018-01-29 MED ORDER — HYDROCODONE-ACETAMINOPHEN 5-325 MG PO TABS: 2.0000 | ORAL_TABLET | Freq: Four times a day (QID) | ORAL | 0 refills | Status: DC | PRN

## 2018-01-29 MED ORDER — METRONIDAZOLE 500 MG PO TABS: 500.0000 mg | ORAL_TABLET | Freq: Three times a day (TID) | ORAL | 0 refills | Status: DC

## 2018-01-29 MED ORDER — CIPROFLOXACIN HCL 500 MG PO TABS: 500.0000 mg | ORAL_TABLET | Freq: Two times a day (BID) | ORAL | 0 refills | Status: AC

## 2018-01-29 MED ORDER — METRONIDAZOLE 500 MG PO TABS: 500.0000 mg | ORAL_TABLET | Freq: Three times a day (TID) | ORAL | 0 refills | Status: AC

## 2018-01-29 MED ORDER — ONDANSETRON 4 MG PO TBDP: 4.0000 mg | ORAL_TABLET | Freq: Three times a day (TID) | ORAL | 0 refills | Status: AC | PRN

## 2018-01-29 MED ORDER — CIPROFLOXACIN HCL 500 MG PO TABS: 500.0000 mg | ORAL_TABLET | Freq: Two times a day (BID) | ORAL | 0 refills | Status: DC

## 2018-01-29 MED ORDER — ONDANSETRON 4 MG PO TBDP: 4.0000 mg | ORAL_TABLET | Freq: Three times a day (TID) | ORAL | 0 refills | Status: DC | PRN

## 2018-01-29 NOTE — Progress Notes (Signed)
[facility] Progress Note  1 Day Post-Op  Subjective: CC: abdominal pain Pain with getting up and moving around, otherwise feels better than before surgery. Has been tolerating by mouth without nausea.   Objective: Vital signs in last 24 hours: Temp:  [97.3 F (36.3 C)-99.1 F (37.3 C)] 98.4 F (36.9 C) (04/25 0424) Pulse Rate:  [88-107] 88 (04/25 0424) Resp:  [12-22] 19 (04/25 0424) BP: (133-152)/(85-96) 137/91 (04/25 0424) SpO2:  [94 %-100 %] 94 % (04/25 0424) Last BM Date: 01/26/18  Intake/Output from previous day: 04/24 0701 - 04/25 0700 In: 6588.3 [P.O.:2040; I.V.:3948.3; IV Piggyback:600] Out: 8070 [Urine:7800; Drains:20; Blood:50] Intake/Output this shift: No intake/output data recorded.  PE: Gen:  Alert, NAD, pleasant Card:  Regular rate and rhythm, pedal pulses 2+ BL Pulm:  Normal effort, clear to auscultation bilaterally Abd: soft, nondistended, appropriately tender around incisions. JP drain output is serosanguineous. Skin: warm and dry, no rashes  Psych: A&Ox3   Lab Results:  Recent Labs    01/28/18 0524 01/29/18 0340  WBC 16.0* 14.5*  HGB 13.5 13.6  HCT 39.7 40.5  PLT 171 236   BMET Recent Labs    01/28/18 0524 01/29/18 0340  NA 136 138  K 3.7 3.7  CL 104 103  CO2 22 23  GLUCOSE 107* 177*  BUN 7 11  CREATININE 0.80 0.90  CALCIUM 8.0* 8.3*   PT/INR No results for input(s): LABPROT, INR in the last 72 hours. CMP     Component Value Date/Time   NA 138 01/29/2018 0340   K 3.7 01/29/2018 0340   CL 103 01/29/2018 0340   CO2 23 01/29/2018 0340   GLUCOSE 177 (H) 01/29/2018 0340   BUN 11 01/29/2018 0340   CREATININE 0.90 01/29/2018 0340   CREATININE 0.90 06/04/2017 0837   CALCIUM 8.3 (L) 01/29/2018 0340   PROT 6.3 (L) 01/29/2018 0340   ALBUMIN 2.9 (L) 01/29/2018 0340   AST 44 (H) 01/29/2018 0340   ALT 49 01/29/2018 0340   ALKPHOS 90 01/29/2018 0340   BILITOT 1.0 01/29/2018 0340   GFRNONAA >60 01/29/2018 0340   GFRNONAA  >89 06/04/2017 0837   GFRAA >60 01/29/2018 0340   GFRAA >89 06/04/2017 0837   Lipase     Component Value Date/Time   LIPASE 23 01/26/2018 1605       Studies/Results: Nm Hepatobiliary Liver Func  Result Date: 01/27/2018 CLINICAL DATA:  47 year old male with right upper quadrant pain, nausea and vomiting. Gallstones. Subsequent encounter. EXAM: NUCLEAR MEDICINE HEPATOBILIARY IMAGING TECHNIQUE: Sequential images of the abdomen were obtained out to 60 minutes following intravenous administration of radiopharmaceutical. 3 mg of morphine administered an additional 60 minutes of images obtained RADIOPHARMACEUTICALS:  5.1 mCi Tc-7963m  Choletec IV COMPARISON:  01/27/2018 ultrasound and CT. FINDINGS: Prompt uptake and biliary excretion of activity by the liver is seen. Biliary activity passes into small bowel, consistent with patent common bile duct. Gallbladder not visualized on the first hour. 3 mg of morphine administered and additional hour of imaging performed and gallbladder not visualized. IMPRESSION: Gallbladder not visualized despite administration of morphine. Cystic duct therefore may be obstructed. In the proper clinical setting, findings may reflect changes of acute cholecystitis. These results will be called to the ordering clinician or representative by the Radiologist Assistant, and communication documented in the PACS or zVision Dashboard. Electronically Signed   By: Lacy DuverneySteven  Olson M.D.   On: 01/27/2018 14:15    Anti-infectives: Anti-infectives (From admission, onward)   Start  Dose/Rate Route Frequency Ordered Stop   01/27/18 1800  ciprofloxacin (CIPRO) IVPB 400 mg     400 mg 200 mL/hr over 60 Minutes Intravenous Every 12 hours 01/27/18 0813     01/27/18 1400  metroNIDAZOLE (FLAGYL) IVPB 500 mg     500 mg 100 mL/hr over 60 Minutes Intravenous Every 8 hours 01/27/18 0813     01/27/18 0300  ciprofloxacin (CIPRO) IVPB 400 mg     400 mg 200 mL/hr over 60 Minutes Intravenous  Once  01/27/18 0248 01/27/18 0438   01/27/18 0300  metroNIDAZOLE (FLAGYL) IVPB 500 mg     500 mg 100 mL/hr over 60 Minutes Intravenous  Once 01/27/18 0248 01/27/18 0443       Assessment/Plan HLD DM on insulin Hypothyroidism  Sepsis Acute gangrenous Cholecystitis status post laparoscopic cholecystectomy, 4/24 - 6mm stone near neck of gallbadder seen on [place], mild wall thickening 5mm - CT showed Mild fluid-filled distention of proximal jejunal loops may also represent sympathetic distention/ileus versus mild small bowel enteritis. No mechanical bowel obstruction. - HIDA positive for acute cholecystitis - WBC downtrending - Tbili decreased to 1   FEN: advance diet as tolerated VTE: SCDs ID: cipro/flagyl 4/23>>  Can potentially go home this afternoon if he continues to do well. He'll need to keep his drain and return to the office next week for drain check. He'll need to continue antibiotics for another week.    LOS: 2 days    Berna Bue , MD [facility] 01/29/2018, 7:54 AM

## 2018-01-29 NOTE — Discharge Summary (Signed)
Raymond Fitzgerald, is a 47 y.o. male  DOB 1971/08/01  MRN [id number].  Admission date:  01/26/2018  Admitting Physician  Briscoe Deutscher, MD  Discharge Date:  01/29/2018   Primary MD  Patient, No Pcp Per  Recommendations for primary care physician for things to follow:   1) postop wound care as advised by surgical team 2) Zofran as needed for nausea and vomiting 3) avoid alcohol while taking Flagyl/metronidazole antibiotic 4) follow-up with general surgeon as advised by surgical team,  5) post-operative/surgical tube/drain care as advised by surgical team   Admission Diagnosis  Gastroenteritis [K52.9] Sepsis, due to unspecified organism [facility]) [A41.9]   Discharge Diagnosis  Gastroenteritis [K52.9] Sepsis, due to unspecified organism [facility]) [A41.9]    Principal Problem:   Sepsis (HCC) Active Problems:   Hyperlipidemia   Uncontrolled diabetes mellitus type 2 without complications (HCC)   Hypothyroidism   Acute cholecystitis      Past Medical History:  Diagnosis Date  . Diabetes mellitus without complication (HCC)   . Hyperlipidemia   . Thyroid disease     Past Surgical History:  Procedure Laterality Date  . CHOLECYSTECTOMY N/A 01/28/2018   Procedure: LAPAROSCOPIC CHOLECYSTECTOMY;  Surgeon: Berna Bue, MD;  Location: [facility] OR;  Service: General;  Laterality: N/A;       HPI  from the history and physical done on the day of admission:   Chief Complaint:  Abdominal pain, vomiting  HPI: Raymond Fitzgerald is a 47 y.o. male with medical history significant of IDDM, hypothyroidism, and HLD presenting with epigastric pain with vomiting for a couple of hours.  He thought it was food poisoning. Symptoms started Sunday evening about 6.  He slept overnight and got up in the AM but he was very weak so he went to the doctor and was sent to the ER due to dehydration.  No h/o prior.  No sick contacts.   No diarrhea.  Vomited at least 10 times.  He was feeling well until then without symptoms.  They went for a buffet together ([place]) and they all ate the same things but he was the only one who got sick.  None of the food tasted bad.  Fever yesterday but none prior.  Intermittent cough which causes his abdominal pain to worsen.   ED Course:  Epigastric pain, n/v without diarrhea.  CT/US with possible acute cholecystitis.  IVF.  Consider HIDA or MRCP.     Hospital Course:     1)Acute calculus cholecystitis- s/p lap chole on 01/28/18, postop care as per surgical team, was treated with Cipro and Flagyl, as well as prn Zofran and opiates.  As per Dr. Phylliss Blakes from surgical service okay to discharge patient home on 01/29/2018, Cipro and Flagyl for 1 week, PRN opiates and Zofran, patient will follow-up for post-op drain removal with surgical service in about a week or so  2)DM- last A1c was 6.9 on 01/02/2018,, okay to resume home diabetic regimen  3)Hypothyroidism-TSH is 4.3 , restart levothyroxine 50 mcg daily  Discharge Condition: stable  Follow UP  Follow-up Information    [facility] Follow up.   Why:  PCP Contact information: [address] [place] [address] [phone]       [facility], [place]. Go on 02/12/2018.   Specialty:  General Surgery Why:  Your post-op follow up appointment is at 2:00 PM. Please arrive 30 min prior to appointment time. Bring photo ID and insurance information.  Contact information: [address] [place] [address] [phone]       [facility], [place]. Go on 02/05/2018.   Specialty:  General Surgery Why:  Your appointment for drain removal is scheduled for 3:00 PM. Please arrive 30 min prior to appointment time. Bring photo ID and insurance information.  Contact information: [address] [place] [address] [phone]            Consults  obtained - gen surgery  Diet and Activity recommendation:  As advised  Discharge Instructions     Discharge Instructions    Call MD for:  difficulty breathing, headache or visual disturbances   Complete by:  As directed    Call MD for:  persistant dizziness or light-headedness   Complete by:  As directed    Call MD for:  persistant nausea and vomiting   Complete by:  As directed    Call MD for:  redness, tenderness, or signs of infection (pain, swelling, redness, odor or green/yellow discharge around incision site)   Complete by:  As directed    Call MD for:  severe uncontrolled pain   Complete by:  As directed    Call MD for:  temperature >100.4   Complete by:  As directed    Diet Carb Modified   Complete by:  As directed    Discharge instructions   Complete by:  As directed    1) postop wound care as advised by surgical team 2) Zofran as needed for nausea and vomiting 3) avoid alcohol while taking Flagyl/metronidazole antibiotic 4) follow-up with general surgeon as advised by surgical team,  5) post-operative/surgical tube/drain care as advised by surgical team   Increase activity slowly   Complete by:  As directed    Activity restrictions/limitations as advised by surgical team        Discharge Medications     Allergies as of 01/29/2018      Reactions   Penicillins Hives, Swelling      Medication List    TAKE these medications   albuterol 108 (90 Base) MCG/ACT inhaler Commonly known as:  PROVENTIL HFA;VENTOLIN HFA Inhale 2 puffs into the lungs every 6 (six) hours as needed for wheezing or shortness of breath.   atorvastatin 20 MG tablet Commonly known as:  LIPITOR TAKE 1 TABLET(20 MG) BY MOUTH DAILY   BD PEN NEEDLE NANO U/F 32G X 4 MM Misc Generic drug:  Insulin Pen Needle USE ONCE DAILY   ciprofloxacin 500 MG tablet Commonly known as:  CIPRO Take 1 tablet (500 mg total) by mouth 2 (two) times daily for 7 days.   Dulaglutide 1.5 MG/0.5ML Sopn Commonly  known as:  TRULICITY Inject 1.5 mg under skin weekly What changed:    how much to take  how to take this  when to take this  additional instructions   glucose blood test strip Commonly known as:  ONETOUCH VERIO Use to test blood sugar 2 times daily as instructed   HYDROcodone-acetaminophen 5-325  MG tablet Commonly known as:  NORCO/VICODIN Take 2 tablets by mouth every 6 (six) hours as needed for moderate pain.   Insulin Detemir 100 UNIT/ML Pen Commonly known as:  LEVEMIR FLEXTOUCH INJECT 25 UNITS UNDER THE SKIN AT BEDTIME   INVOKANA 100 MG Tabs tablet Generic drug:  canagliflozin TAKE 1 TABLET BY MOUTH DAILY BEFORE BREAKFAST   levothyroxine 50 MCG tablet Commonly known as:  SYNTHROID, LEVOTHROID TAKE 1 TABLET(50 MCG) BY MOUTH DAILY BEFORE BREAKFAST   metFORMIN 1000 MG tablet Commonly known as:  GLUCOPHAGE TAKE 1 TABLET(1000 MG) BY MOUTH TWICE DAILY WITH A MEAL   metroNIDAZOLE 500 MG tablet Commonly known as:  FLAGYL Take 1 tablet (500 mg total) by mouth 3 (three) times daily for 7 days.   ondansetron 4 MG disintegrating tablet Commonly known as:  ZOFRAN ODT Take 1 tablet (4 mg total) by mouth every 8 (eight) hours as needed for nausea or vomiting.   ONETOUCH DELICA LANCETS 33G Misc CHECK BLOOD SUGAR TWICE DAILY AS DIRECTED       Major procedures and Radiology Reports - PLEASE review detailed and final reports for all details, in brief -   Nm Hepatobiliary Liver Func  Result Date: 01/27/2018 CLINICAL DATA:  47 year old male with right upper quadrant pain, nausea and vomiting. Gallstones. Subsequent encounter. EXAM: NUCLEAR MEDICINE HEPATOBILIARY IMAGING TECHNIQUE: Sequential images of the abdomen were obtained out to 60 minutes following intravenous administration of radiopharmaceutical. 3 mg of morphine administered an additional 60 minutes of images obtained RADIOPHARMACEUTICALS:  5.1 mCi Tc-4563m  Choletec IV COMPARISON:  01/27/2018 ultrasound and CT. FINDINGS:  Prompt uptake and biliary excretion of activity by the liver is seen. Biliary activity passes into small bowel, consistent with patent common bile duct. Gallbladder not visualized on the first hour. 3 mg of morphine administered and additional hour of imaging performed and gallbladder not visualized. IMPRESSION: Gallbladder not visualized despite administration of morphine. Cystic duct therefore may be obstructed. In the proper clinical setting, findings may reflect changes of acute cholecystitis. These results will be called to the ordering clinician or representative by the Radiologist Assistant, and communication documented in the PACS or zVision Dashboard. Electronically Signed   By: Lacy DuverneySteven  Olson M.D.   On: 01/27/2018 14:15   [place] Abdomen Complete  Result Date: 01/27/2018 CLINICAL DATA:  Right upper quadrant and epigastric pain with vomiting x1 day. EXAM: ABDOMEN ULTRASOUND COMPLETE COMPARISON:  Same day CT FINDINGS: Gallbladder: Intraluminal shadowing 6 mm calculus noted within the gallbladder neck. No sonographic Murphy sign noted by sonographer gallbladder slightly distended up to 5 mm in thickness. Common bile duct: Diameter: Normal at 5.7 mm Liver: No focal lesion identified. Hepatic steatosis with focal fatty sparing. Portal vein is patent on color Doppler imaging with normal direction of blood flow towards the liver. IVC: No abnormality visualized. Pancreas: Visualized portion unremarkable. Most of the gland was obscured by overlying bowel. Spleen: Size and appearance within normal limits. Right Kidney: Length: 10.9 cm. Echogenicity within normal limits. No mass or hydronephrosis visualized. Left Kidney: Length: 11.9 cm. Echogenicity within normal limits. No mass or hydronephrosis visualized. Abdominal aorta: No aneurysm visualized. Other findings: None. IMPRESSION: 1. Fatty liver with focal fatty sparing. 2. Mild thickening of the gallbladder wall up to 5 mm in single wall thickness (normal 3 mm or  less) with distension similar to CT findings. A 6 mm calculus is noted near the neck of the gallbladder. Other than the gallbladder wall thickening, calculus and mild distension, no additional signs  supporting acute cholecystitis are identified. Electronically Signed   By: Tollie Eth M.D.   On: 01/27/2018 03:48   Ct Abdomen Pelvis W Contrast  Result Date: 01/27/2018 CLINICAL DATA:  Nausea, vomiting and abdominal pain. EXAM: CT ABDOMEN AND PELVIS WITH CONTRAST TECHNIQUE: Multidetector CT imaging of the abdomen and pelvis was performed using the standard protocol following bolus administration of intravenous contrast. CONTRAST:  ISOVUE-300 IOPAMIDOL (ISOVUE-300) INJECTION 61% COMPARISON:  None. FINDINGS: Lower chest: Normal size heart. No pericardial effusion. Bibasilar atelectasis. No pneumothorax or pleural effusion. Hepatobiliary: The gallbladder appears distended without wall thickening. There is a 4 mm calculus without obstruction noted. Right upper quadrant mesenteric edema and pericholecystic edema is seen. It is uncertain whether this is secondary to a cholecystitis versus distal gastritis/peptic ulcer disease, less likely duodenitis given lack of duodenal thickening. Pancreas: The pancreas appears unremarkable without ductal dilatation. Spleen: Normal in size without focal abnormality. Adrenals/Urinary Tract: Normal bilateral adrenal glands. Symmetric enhancement of both kidneys. No nephrolithiasis nor obstructive uropathy. No hydroureteronephrosis. The bladder is distended without focal mural thickening or calculi. Stomach/Bowel: The gastric antrum and pyloric region appears somewhat thickened and edematous. Right upper quadrant mesenteric edema is also noted. Possibility of peptic ulcer disease and/or gastritis is raised. No bowel obstruction. Mild fluid-filled distention of proximal jejunum. Vascular/Lymphatic: No significant vascular findings are present. No enlarged abdominal or pelvic lymph  nodes. Reproductive: Prostate is unremarkable. Other: Mild herniation of properitoneal fat within the proximal inguinal canals bilaterally. Musculoskeletal: No acute or significant osseous findings. IMPRESSION: 1. The gallbladder is distended and contains a 4 mm calculus. There is mild soft tissue edema in the right upper quadrant. Differential possibilities may include changes from acute cholecystitis, peptic ulcer disease/gastritis, less likely duodenitis. A HIDA scan may help for further correlation. 2. Mild fluid-filled distention of proximal jejunal loops may also represent sympathetic distention/ileus versus mild small bowel enteritis. No mechanical bowel obstruction. Electronically Signed   By: Tollie Eth M.D.   On: 01/27/2018 01:05    Micro Results   Recent Results (from the past 240 hour(s))  Culture, blood (Routine X 2) w Reflex to ID Panel     Status: None (Preliminary result)   Collection Time: 01/27/18 12:51 AM  Result Value Ref Range Status   Specimen Description BLOOD LEFT ANTECUBITAL  Final   Special Requests   Final    BOTTLES DRAWN AEROBIC AND ANAEROBIC Blood Culture adequate volume   Culture   Final    NO GROWTH 2 DAYS Performed at [facility], [address], [place], [place] [address]    Report Status PENDING  Incomplete  Culture, blood (Routine X 2) w Reflex to ID Panel     Status: None (Preliminary result)   Collection Time: 01/27/18  2:00 AM  Result Value Ref Range Status   Specimen Description BLOOD RIGHT HAND  Final   Special Requests   Final    BOTTLES DRAWN AEROBIC AND ANAEROBIC Blood Culture adequate volume   Culture   Final    NO GROWTH 2 DAYS Performed at [facility], [address] [place], [place] [address]    Report Status PENDING  Incomplete  Surgical pcr screen     Status: None   Collection Time: 01/27/18  7:39 PM  Result Value Ref Range Status   MRSA, PCR NEGATIVE NEGATIVE Final   Staphylococcus aureus NEGATIVE NEGATIVE Final     Comment: (NOTE) The Xpert SA Assay (FDA approved for NASAL specimens in  patients 24 years of age and older), is one component of a comprehensive surveillance program. It is not intended to diagnose infection nor to guide or monitor treatment. Performed at [facility], [address], [place], [place] [address]        Today   Subjective    Raymond Fitzgerald today has no new complaints, eager to go home, family at bedside    , tolerating oral intake well no fevers or chills      Patient has been seen and examined prior to discharge   Objective   Blood pressure (!) 136/95, pulse 80, temperature 99 F (37.2 C), temperature source Oral, resp. rate 18, height 5\' 8"  (1.727 m), weight 77 kg (169 lb 12.1 oz), SpO2 98 %.   Intake/Output Summary (Last 24 hours) at 01/29/2018 1742 Last data filed at 01/29/2018 1351 Gross per 24 hour  Intake 5467.5 ml  Output 6300 ml  Net -832.5 ml    Exam Gen:- Awake Alert,  In no apparent distress  HEENT:- [place].AT, No sclera icterus Neck-Supple Neck,No JVD,.  Lungs-  CTAB , good air movement CV- S1, S2 normal Abd-  +ve B.Sounds, Abd Soft, appropriate postop tenderness Extremity/Skin:- No  edema,   Good pulses Psych-affect is appropriate, oriented x3 Neuro-no new focal deficits, no tremors   Data Review   CBC w Diff:  Lab Results  Component Value Date   WBC 14.5 (H) 01/29/2018   HGB 13.6 01/29/2018   HCT 40.5 01/29/2018   PLT 236 01/29/2018    CMP:  Lab Results  Component Value Date   NA 138 01/29/2018   K 3.7 01/29/2018   CL 103 01/29/2018   CO2 23 01/29/2018   BUN 11 01/29/2018   CREATININE 0.90 01/29/2018   CREATININE 0.90 06/04/2017   PROT 6.3 (L) 01/29/2018   ALBUMIN 2.9 (L) 01/29/2018   BILITOT 1.0 01/29/2018   ALKPHOS 90 01/29/2018   AST 44 (H) 01/29/2018   ALT 49 01/29/2018    Total Discharge time is about 33 minutes  Shon Hale M.D on 01/29/2018 at 5:42 PM  [facility]   Office   [phone]  Voice Recognition Reubin [employer] dictation system was used to create this note, attempts have been made to correct errors. Please contact the author with questions and/or clarifications.

## 2018-01-29 NOTE — Progress Notes (Signed)
Dr. Phylliss Blakeshelsea Connor returned page.  OK to discharge from their standpoint.  Asked to let Dr. Mariea ClontsEmokpae know.  Paged Dr. Mariea ClontsEmokpae.

## 2018-01-29 NOTE — Anesthesia Postprocedure Evaluation (Signed)
Anesthesia Post Note  Patient: [name]  Procedure(s) Performed: LAPAROSCOPIC CHOLECYSTECTOMY (N/A Abdomen)     Patient location during evaluation: PACU Anesthesia Type: General Level of consciousness: awake and alert Pain management: pain level controlled Vital Signs Assessment: post-procedure vital signs reviewed and stable Respiratory status: spontaneous breathing, nonlabored ventilation, respiratory function stable and patient connected to nasal cannula oxygen Cardiovascular status: blood pressure returned to baseline and stable Postop Assessment: no apparent nausea or vomiting Anesthetic complications: no    Last Vitals:  Vitals:   01/28/18 2027 01/29/18 0424  BP: (!) 145/92 (!) 137/91  Pulse: (!) 106 88  Resp: 16 19  Temp: 37.2 C 36.9 C  SpO2: 98% 94%    Last Pain:  Vitals:   01/29/18 0637  TempSrc:   PainSc: Asleep                 Jandiel Magallanes

## 2018-01-29 NOTE — Progress Notes (Signed)
Discharge instructions given, pt verbalized understanding.  VSS.  Pt left floor via wheelchair accompanied by staff and family.  Demonstrated to wife how to empty JP drain and reset.suction.  Wife verbalized understanding.

## 2018-01-29 NOTE — Progress Notes (Signed)
Paged Sug[place] Dr. Twana Firsthelsea Conner. Pt would like to be discharged home.  Also, spoke with Dr.Emokpae, he cannot discharge until surgery says it is OK.

## 2018-01-29 NOTE — Discharge Instructions (Signed)
1) postop wound care as advised by surgical team 2) Zofran as needed for nausea and vomiting 3) avoid alcohol while taking Flagyl/metronidazole antibiotic 4) follow-up with general surgeon as advised by surgical team,  5) post-operative/surgical tube/drain care as advised by surgical team   Please arrive at least 30 min before your appointment to complete your check in paperwork.  If you are unable to arrive 30 min prior to your appointment time we may have to cancel or reschedule you.  LAPAROSCOPIC SURGERY: POST OP INSTRUCTIONS  1. DIET: Follow a light bland diet the first 24 hours after arrival home, such as soup, liquids, crackers, etc. Be sure to include lots of fluids daily. Avoid fast food or heavy meals as your are more likely to get nauseated. Eat a low fat the next few days after surgery.  2. Take your usually prescribed home medications unless otherwise directed. 3. PAIN CONTROL:  1. Pain is best controlled by a usual combination of three different methods TOGETHER:  1. Ice/Heat 2. Over the counter pain medication 3. Prescription pain medication 2. Most patients will experience some swelling and bruising around the incisions. Ice packs or heating pads (30-60 minutes up to 6 times a day) will help. Use ice for the first few days to help decrease swelling and bruising, then switch to heat to help relax tight/sore spots and speed recovery. Some people prefer to use ice alone, heat alone, alternating between ice & heat. Experiment to what works for you. Swelling and bruising can take several weeks to resolve.  3. It is helpful to take an over-the-counter pain medication regularly for the first few weeks. Choose one of the following that works best for you:  1. Naproxen (Aleve, etc) Two 220mg  tabs twice a day 2. Ibuprofen (Advil, etc) Three 200mg  tabs four times a day (every meal & bedtime) 3. Acetaminophen (Tylenol, etc) 500-650mg  four times a day (every meal & bedtime) 4. A prescription  for pain medication (such as oxycodone, hydrocodone, etc) should be given to you upon discharge. Take your pain medication as prescribed.  1. If you are having problems/concerns with the prescription medicine (does not control pain, nausea, vomiting, rash, itching, etc), please call us [phone] to see if we need to switch you to a different pain medicine that will work better for you and/or control your side effect better. 2. If you need a refill on your pain medication, please contact your pharmacy. They will contact our office to request authorization. Prescriptions will not be filled after 5 pm or on week-ends. 4. Avoid getting constipated. Between the surgery and the pain medications, it is common to experience some constipation. Increasing fluid intake and taking a fiber supplement (such as Metamucil, Citrucel, FiberCon, MiraLax, etc) 1-2 times a day regularly will usually help prevent this problem from occurring. A mild laxative (prune juice, Milk of Magnesia, MiraLax, etc) should be taken according to package directions if there are no bowel movements after 48 hours.  5. Watch out for diarrhea. If you have many loose bowel movements, simplify your diet to bland foods & liquids for a few days. Stop any stool softeners and decrease your fiber supplement. Switching to mild anti-diarrheal medications (Kayopectate, Pepto Bismol) can help. If this worsens or does not improve, please call us. 6. Wash / shower every day. You may shower over the dressings as they are waterproof. Continue to shower over incision(s) after the dressing is off. 7. Remove your waterproof bandages 5 days after surgery.  You may leave the incision open to air. You may replace a dressing/Band-Aid to cover the incision for comfort if you wish.  8. ACTIVITIES as tolerated:  1. You may resume regular (light) daily activities beginning the next day--such as daily self-care, walking, climbing stairs--gradually increasing activities as  tolerated. If you can walk 30 minutes without difficulty, it is safe to try more intense activity such as jogging, treadmill, bicycling, low-impact aerobics, swimming, etc. 2. Save the most intensive and strenuous activity for last such as sit-ups, heavy lifting, contact sports, etc Refrain from any heavy lifting or straining until you are off narcotics for pain control.  3. DO NOT PUSH THROUGH PAIN. Let pain be your guide: If it hurts to do something, don't do it. Pain is your body warning you to avoid that activity for another week until the pain goes down. 4. You may drive when you are no longer taking prescription pain medication, you can comfortably wear a seatbelt, and you can safely maneuver your car and apply brakes. 5. You may have sexual intercourse when it is comfortable.  9. FOLLOW UP in our office  1. Please call [facility] at [phone] to set up an appointment to see your surgeon in the office for a follow-up appointment approximately 2-3 weeks after your surgery. 2. Make sure that you call for this appointment the day you arrive home to insure a convenient appointment time.      10. IF YOU HAVE DISABILITY OR FAMILY LEAVE FORMS, BRING THEM TO THE               OFFICE FOR PROCESSING.   WHEN TO CALL us [phone]:  1. Poor pain control 2. Reactions / problems with new medications (rash/itching, nausea, etc)  3. Fever over 101.5 F (38.5 C) 4. Inability to urinate 5. Nausea and/or vomiting 6. Worsening swelling or bruising 7. Continued bleeding from incision. 8. Increased pain, redness, or drainage from the incision  The clinic staff is available to answer your questions during regular business hours (8:30am-5pm). Please dont hesitate to call and ask to speak to one of our nurses for clinical concerns.  If you have a medical emergency, go to the nearest emergency room or call 911.  A surgeon from [facility] is always on call at the [facility], [place]  [address], [place], [place] [address] ?  MAIN: [phone] ? TOLL FREE: [phone] ?  FAX [phone]  [email or web address]    1) postop wound care as advised by surgical team 2) Zofran as needed for nausea and vomiting 3) avoid alcohol while taking Flagyl/metronidazole antibiotic 4) follow-up with general surgeon as advised by surgical team,  5) post-operative/surgical tube/drain care as advised by surgical team

## 2018-02-01 LAB — CULTURE, BLOOD (ROUTINE X 2)
CULTURE: NO GROWTH
CULTURE: NO GROWTH
SPECIAL REQUESTS: ADEQUATE
Special Requests: ADEQUATE

## 2018-02-12 ENCOUNTER — Other Ambulatory Visit: Payer: Self-pay | Admitting: Internal Medicine

## 2018-02-17 ENCOUNTER — Other Ambulatory Visit: Payer: Self-pay | Admitting: Internal Medicine

## 2018-02-20 ENCOUNTER — Ambulatory Visit: Payer: BLUE CROSS/BLUE SHIELD | Admitting: Physician Assistant

## 2018-02-20 ENCOUNTER — Encounter: Payer: Self-pay | Admitting: Physician Assistant

## 2018-02-20 ENCOUNTER — Other Ambulatory Visit: Payer: Self-pay

## 2018-02-20 VITALS — BP 102/70 | HR 94 | Temp 98.8°F | Resp 18 | Ht 67.32 in | Wt 153.0 lb

## 2018-02-20 DIAGNOSIS — J4520 Mild intermittent asthma, uncomplicated: Secondary | ICD-10-CM | POA: Diagnosis not present

## 2018-02-20 DIAGNOSIS — R200 Anesthesia of skin: Secondary | ICD-10-CM

## 2018-02-20 DIAGNOSIS — R202 Paresthesia of skin: Secondary | ICD-10-CM

## 2018-02-20 MED ORDER — ALBUTEROL SULFATE HFA 108 (90 BASE) MCG/ACT IN AERS: 2.0000 | INHALATION_SPRAY | Freq: Four times a day (QID) | RESPIRATORY_TRACT | 0 refills | Status: AC | PRN

## 2018-02-20 MED ORDER — MELOXICAM 7.5 MG PO TABS: 7.5000 mg | ORAL_TABLET | Freq: Every day | ORAL | 0 refills | Status: AC

## 2018-02-20 NOTE — Progress Notes (Signed)
Raymond Fitzgerald  MRN: [id number] DOB: 04-18-1971  PCP: Morrell Riddle, PA-C  Chief Complaint  Patient presents with  . Hand Pain    right fingers feel numb just had gallbladder surgery     Subjective:  Pt presents to clinic for right 4th and 5th finger numb.   Had gallbladder surgery 4/24 and was discharged from hospital 4/25.  For the week following surgery he was on pain medications and was just sleeping.  10d ago he noticed that his right 4th and 5th digit were numb.  He has no pain. He only notices it when he touches his fingers. No neck pain. No elbow pain.  No injury that he knows of to his left elbow or wrist.  He talked with his surgeon and the surgeon could not determine a cause to his paresthesias so suggested he see his PCP.    He also needs a refill on his albuterol inhaler.  He rarely uses it.   History is obtained by patient.  Review of Systems  Constitutional: Negative for chills and fever.  Neurological: Negative for weakness.    Patient Active Problem List   Diagnosis Date Noted  . Status post cholecystectomy 01/27/2018  . Hypothyroidism 08/02/2015  . Uncontrolled diabetes mellitus type 2 without complications (HCC) 09/15/2014  . Essential hypertension 08/24/2014  . Hyperlipidemia 08/24/2014    Current Outpatient Medications on File Prior to Visit  Medication Sig Dispense Refill  . atorvastatin (LIPITOR) 20 MG tablet TAKE 1 TABLET(20 MG) BY MOUTH DAILY 90 tablet 3  . BD PEN NEEDLE NANO U/F 32G X 4 MM MISC USE ONCE DAILY 100 each 0  . Dulaglutide (TRULICITY) 1.5 MG/0.5ML SOPN Inject 1.5 mg under skin weekly (Patient taking differently: Inject 1.5 mg into the skin once a week. On Sunday) 12 pen 3  . glucose blood (ONETOUCH VERIO) test strip Use to test blood sugar 2 times daily as instructed 200 each 0  . Insulin Detemir (LEVEMIR FLEXTOUCH) 100 UNIT/ML Pen INJECT 25 UNITS UNDER THE SKIN AT BEDTIME 15 mL 5  . INVOKANA 100 MG TABS tablet TAKE 1 TABLET BY  MOUTH DAILY BEFORE BREAKFAST 30 tablet 0  . LEVEMIR FLEXTOUCH 100 UNIT/ML Pen INJECT 30 UNITS UNDER THE SKIN AT BEDTIME 15 mL 0  . levothyroxine (SYNTHROID, LEVOTHROID) 50 MCG tablet TAKE 1 TABLET(50 MCG) BY MOUTH DAILY BEFORE BREAKFAST 90 tablet 3  . metFORMIN (GLUCOPHAGE) 1000 MG tablet TAKE 1 TABLET(1000 MG) BY MOUTH TWICE DAILY WITH A MEAL 180 tablet 3  . ondansetron (ZOFRAN ODT) 4 MG disintegrating tablet Take 1 tablet (4 mg total) by mouth every 8 (eight) hours as needed for nausea or vomiting. (Patient not taking: Reported on 02/20/2018) 10 tablet 0   No current facility-administered medications on file prior to visit.     Allergies  Allergen Reactions  . Penicillins Hives and Swelling    Past Medical History:  Diagnosis Date  . Diabetes mellitus without complication (HCC)   . Hyperlipidemia   . Thyroid disease    Social History   Social History Narrative  . Not on file   Social History   Tobacco Use  . Smoking status: Never Smoker  . Smokeless tobacco: Never Used  Substance Use Topics  . Alcohol use: Yes    Comment: occasional  . Drug use: No   family history is not on file.     Objective:  BP 102/70   Pulse 94   Temp 98.8 F (37.1 C) (Oral)  Resp 18   Ht 5' 7.32" (1.71 m)   Wt 153 lb (69.4 kg)   SpO2 97%   BMI 23.73 kg/m  Body mass index is 23.73 kg/m.  Physical Exam  Constitutional: He is oriented to person, place, and time. He appears well-developed and well-nourished.  HENT:  Head: Normocephalic and atraumatic.  Right Ear: External ear normal.  Left Ear: External ear normal.  Eyes: Conjunctivae are normal.  Neck: Normal range of motion.  Cardiovascular: Normal rate, regular rhythm and normal heart sounds.  No murmur heard. Pulmonary/Chest: Effort normal and breath sounds normal. He has no wheezes.  Musculoskeletal:       Right elbow: He exhibits normal range of motion and no swelling. Tenderness (mild tenderness over the ulnar nerve ) found.   Neurological: He is alert and oriented to person, place, and time. He has normal strength. A sensory deficit (decreased sensation on the right 4th 5th digit startin in the hand -nothing in the forearm) is present.  Skin: Skin is warm and dry.  Psychiatric: He has a normal mood and affect. His behavior is normal. Judgment and thought content normal.  Vitals reviewed.   Assessment and Plan :  Paresthesia - Plan: meloxicam (MOBIC) 7.5 MG tablet  Mild intermittent asthma without complication - Plan: albuterol (PROVENTIL HFA;VENTOLIN HFA) 108 (90 Base) MCG/ACT inhaler  Numbness and tingling in right hand - likely ulnar nerve irritation - he ice and use NSAIDs to help with local inflammation - suspect patient was resting on this elbow during his recovery - he was encouraged to not rest on this elbow - if this is not getting nay better we will send him to ortho or possible US of the area to look for inflammation and maybe nerve conduction studied but we need to give it time 1st.  He understood the plan.    Benny Lennert PA-C  Primary Care at [facility] 02/20/2018 8:51 AM

## 2018-02-20 NOTE — Patient Instructions (Addendum)
  Ice to the elbow Try to not rest on the right elbow. We will start an antiinflammatory to help get the numbness to go away.   IF you received an x-ray today, you will receive an invoice from [facility]. Please contact [facility] at [phone] with questions or concerns regarding your invoice.   IF you received labwork today, you will receive an invoice from [facility]. Please contact [facility] at [phone] with questions or concerns regarding your invoice.   Our billing staff will not be able to assist you with questions regarding bills from these companies.  You will be contacted with the lab results as soon as they are available. The fastest way to get your results is to activate your My Chart account. Instructions are located on the last page of this paperwork. If you have not heard from [place] regarding the results in 2 weeks, please contact this office.

## 2018-02-24 ENCOUNTER — Other Ambulatory Visit: Payer: Self-pay | Admitting: Internal Medicine

## 2018-03-03 ENCOUNTER — Other Ambulatory Visit: Payer: Self-pay | Admitting: Internal Medicine

## 2018-03-09 ENCOUNTER — Encounter: Payer: Self-pay | Admitting: Physician Assistant

## 2018-03-09 ENCOUNTER — Other Ambulatory Visit: Payer: Self-pay | Admitting: Internal Medicine

## 2018-03-09 DIAGNOSIS — R202 Paresthesia of skin: Secondary | ICD-10-CM

## 2018-03-12 ENCOUNTER — Other Ambulatory Visit: Payer: Self-pay | Admitting: Physician Assistant

## 2018-03-12 DIAGNOSIS — R202 Paresthesia of skin: Secondary | ICD-10-CM

## 2018-03-12 NOTE — Telephone Encounter (Signed)
Patient called and he says he doesn't need this medication refilled.

## 2018-03-16 ENCOUNTER — Other Ambulatory Visit: Payer: Self-pay | Admitting: Physician Assistant

## 2018-03-16 DIAGNOSIS — J4520 Mild intermittent asthma, uncomplicated: Secondary | ICD-10-CM

## 2018-03-27 ENCOUNTER — Other Ambulatory Visit: Payer: Self-pay | Admitting: Internal Medicine

## 2018-03-31 ENCOUNTER — Other Ambulatory Visit: Payer: Self-pay | Admitting: Internal Medicine

## 2018-04-17 ENCOUNTER — Other Ambulatory Visit: Payer: Self-pay

## 2018-04-17 MED ORDER — CANAGLIFLOZIN 100 MG PO TABS: ORAL_TABLET | ORAL | 0 refills | Status: DC

## 2018-04-20 ENCOUNTER — Other Ambulatory Visit: Payer: Self-pay | Admitting: Internal Medicine

## 2018-04-21 ENCOUNTER — Ambulatory Visit: Payer: Self-pay | Admitting: Internal Medicine

## 2018-04-21 ENCOUNTER — Encounter: Payer: Self-pay | Admitting: Internal Medicine

## 2018-04-21 NOTE — Progress Notes (Deleted)
Patient ID: Raymond Fitzgerald, male   DOB: 1971-02-13, 47 y.o.   MRN: [id number]  HPI: Raymond Fitzgerald is a 47 y.o.-year-old male, returning for f/u for DM2, dx 2011, insulin-dependent since 2014, uncontrolled, without long term complications and also hypothyroidism. [place].  PCP: Dr. Pershing ProudWilliam Dwight. Last visit  4 months ago.  DM2: Last hemoglobin A1c was: Lab Results  Component Value Date   HGBA1C 6.9 01/02/2018   HGBA1C 6.4 09/04/2017   HGBA1C 5.9 06/04/2017  10/2014: HbA1c 9.5% 05/2014: HbA1c 8.5%  He is on: - Metformin 1000 mg 2x a day with meal - Invokana 100 mg daily in am - Glipizide 5 mg before a large dinner >> lunch - Trulicity 1.5 mg weekly - Levemir 25 units at bedtime He was on Bydureon 2 mg weekly >> nodules under skin at inj sites >> Tanzeum >> Victoza 1.8 mg daily >> not covered  Pt checks his sugars twice a day per log: - am:  135 >> 128 >> 110-130 - 2h after b'fast: 79, 106-146 >> 147 >> n/c - before lunch: 77-108 >> 86, 92 >> n/c - 2h after lunch: 160, 175 >> 130-180 - before dinner: 125 >> 109 >> 110-150 - 2h after dinner: 185-195 (icecream) >> n/c - bedtime: n/c >> 150-160 - nighttime: n/c Lowest sugar was 64 >> 110 >> ***; he has hypoglycemia awareness  in the 70s. Highest sugar was 175 >> 180 (sweets) >> ***.  Meter: GE ([facility]  Pt's meals are: - Breakfast: tea (unsweet) + cookies >> 12 grain bread + butter + tea  - Lunch: tortilla + vegetables, chicken/fish/seafood - Dinner: same - Snacks: 2 : protein bars mid-pm; post dinner: fruit, bar Occasionally drinks cinnamon water.  - No CKD, last BUN/creatinine:  Lab Results  Component Value Date   BUN 11 01/29/2018   CREATININE 0.90 01/29/2018  Not on ACE inhibitor/ARB  No MAU: Lab Results  Component Value Date   MICRALBCREAT 1.0 06/04/2017    -+ HL; last set of lipids: Lab Results  Component Value Date   CHOL 117 06/04/2017   HDL 25.00 (L)  06/04/2017   LDLCALC 59 06/04/2017   LDLDIRECT 95.0 08/22/2016   TRIG 164.0 (H) 06/04/2017   CHOLHDL 5 06/04/2017  On Lipitor 20, fish oil. - last eye exam was in 05/2016: No DR  - no  numbness and tingling in his feet.    Hypothyroidism:  Pt is on levothyroxine 50 mcg daily, taken: - in am - fasting - at least 30 min from b'fast - no Ca, Fe,PPIs  - + MVIs at night - not on Biotin  Reviewed latest TSH level: Lab Results  Component Value Date   TSH 4.622 (H) 01/27/2018   Pt denies: - feeling nodules in neck - hoarseness - dysphagia - choking - SOB with lying down  ROS: Constitutional: no weight gain/no weight loss, no fatigue, no subjective hyperthermia, no subjective hypothermia Eyes: no blurry vision, no xerophthalmia ENT: no sore throat, + see HPI Cardiovascular: no CP/no SOB/no palpitations/no leg swelling Respiratory: no cough/no SOB/no wheezing Gastrointestinal: no N/no V/no D/no C/no acid reflux Musculoskeletal: no muscle aches/no joint aches Skin: no rashes, no hair loss Neurological: no tremors/no numbness/no tingling/no dizziness  I reviewed pt's medications, allergies, PMH, social hx, family hx, and changes were documented in the history of present illness. Otherwise, unchanged from my initial visit note.   Past Medical History:  Diagnosis Date  . Diabetes  mellitus without complication (HCC)   . Hyperlipidemia   . Thyroid disease    Past Surgical History:  Procedure Laterality Date  . CHOLECYSTECTOMY N/A 01/28/2018   Procedure: LAPAROSCOPIC CHOLECYSTECTOMY;  Surgeon: Berna Bue, MD;  Location: [facility] OR;  Service: General;  Laterality: N/A;   History   Social History  . Marital Status: Married    Spouse Name: N/A    Number of Children: 2   Occupational History  . Sales assistant   Social History Main Topics  . Smoking status: Never Smoker   . Smokeless tobacco: Not on file  . Alcohol Use: Yes  . Drug Use: No   Current Outpatient  Medications  Medication Sig Dispense Refill  . albuterol (PROVENTIL HFA;VENTOLIN HFA) 108 (90 Base) MCG/ACT inhaler Inhale 2 puffs into the lungs every 6 (six) hours as needed for wheezing or shortness of breath. 1 Inhaler 0  . atorvastatin (LIPITOR) 20 MG tablet TAKE 1 TABLET(20 MG) BY MOUTH DAILY 90 tablet 3  . BD PEN NEEDLE NANO U/F 32G X 4 MM MISC USE ONCE DAILY 200 each 0  . canagliflozin (INVOKANA) 100 MG TABS tablet TAKE 1 TABLET BY MOUTH DAILY BEFORE BREAKFAST 90 tablet 0  . Dulaglutide (TRULICITY) 1.5 MG/0.5ML SOPN Inject 1.5 mg under skin weekly (Patient taking differently: Inject 1.5 mg into the skin once a week. On Sunday) 12 pen 3  . Insulin Detemir (LEVEMIR FLEXTOUCH) 100 UNIT/ML Pen INJECT 25 UNITS UNDER THE SKIN AT BEDTIME 15 mL 5  . LEVEMIR FLEXTOUCH 100 UNIT/ML Pen INJECT 30 UNITS UNDER THE SKIN AT BEDTIME 15 mL 0  . levothyroxine (SYNTHROID, LEVOTHROID) 50 MCG tablet TAKE 1 TABLET(50 MCG) BY MOUTH DAILY BEFORE BREAKFAST 90 tablet 3  . meloxicam (MOBIC) 7.5 MG tablet Take 1 tablet (7.5 mg total) by mouth daily. 30 tablet 0  . metFORMIN (GLUCOPHAGE) 1000 MG tablet TAKE 1 TABLET(1000 MG) BY MOUTH TWICE DAILY WITH A MEAL 180 tablet 3  . ondansetron (ZOFRAN ODT) 4 MG disintegrating tablet Take 1 tablet (4 mg total) by mouth every 8 (eight) hours as needed for nausea or vomiting. (Patient not taking: Reported on 02/20/2018) 10 tablet 0  . ONETOUCH VERIO test strip USE TO TEST BLOOD SUGAR TWICE DAILY AS DIRECTED 200 each 5  . TRULICITY 1.5 MG/0.5ML SOPN INJECT 1.5 MG UNDER THE SKIN WEEKLY 2 mL 0   No current facility-administered medications for this visit.    Allergies  Allergen Reactions  . Penicillins Hives and Swelling   FH: - see HPI - HL in M, F - cancer in M  PE: There were no vitals taken for this visit.  Wt Readings from Last 3 Encounters:  02/20/18 153 lb (69.4 kg)  01/27/18 169 lb 12.1 oz (77 kg)  01/02/18 161 lb 3.2 oz (73.1 kg)   Constitutional: overweight,  in NAD Eyes: PERRLA, EOMI, no exophthalmos ENT: moist mucous membranes, no thyromegaly, no cervical lymphadenopathy Cardiovascular: RRR, No MRG Respiratory: CTA B Gastrointestinal: abdomen soft, NT, ND, BS+ Musculoskeletal: no deformities, strength intact in all 4 Skin: moist, warm, no rashes Neurological: no tremor with outstretched hands, DTR normal in all 4  ASSESSMENT: 1. DM2, insulin-dependent, uncontrolled, without long term complications  2. Hypothyroidism  3. HL  PLAN:  1. Patient with previously uncontrolled diabetes, now with better control after adding GLP-1 receptor agonist.  We were able to decrease his Levemir dose after starting Trulicity   As he was having low blood sugars in the 60s or  70s.  At last visit, his sugars were at goal in the morning but they were increasing and they were slightly higher after lunch and before dinner so we discussed about trying to add glipizide before lunch and also discussed about ways to improve what he eats for lunch.  He was telling me that he was interested in coming off insulin and we discussed about ways to reduce his insulin resistance.  However, at last visit, we continued the same dose of Levemir.  - advised him to: Patient Instructions  Please continue: - Metformin 1000 mg 2x a day with meal - Invokana 100 mg daily in am - Glipizide 5 mg before lunch - Trulicity 1.5 mg weekly - Levemir 25 units at bedtime  Please call and schedule an eye appt with Dr. Randon Goldsmith: [facility]:  Dr. Jeni Salles MD ?  Address: [address] [place], [place], [place] [address]  [phone]  Please increase levothyroxine to 75 mcg daily  Take the thyroid hormone every day, with water, at least 30 minutes before breakfast, separated by at least 4 hours from: - acid reflux medications - calcium - iron - multivitamins  Please come back for a follow-up appointment in 3-4 months.  - today, HbA1c is 7%  - continue checking  sugars at different times of the day - check 1x a day, rotating checks - advised for yearly eye exams >> he is UTD - Return to clinic in 3-4 mo with sugar log   2. Hypothyroidism - latest thyroid labs reviewed with pt >> slightly high 3 months ago: Lab Results  Component Value Date   TSH 4.622 (H) 01/27/2018  - he continues on LT4  50 mcg daily >> will increase to 75 mcg daily  - we discussed about taking the thyroid hormone every day, with water, >30 minutes before breakfast, separated by >4 hours from acid reflux medications, calcium, iron, multivitamins. Pt. is taking it correctly.  3. HL - Reviewed latest lipid panel from 05/2017: LDL much improved, with still high triglycerides and low HDL Lab Results  Component Value Date   CHOL 117 06/04/2017   HDL 25.00 (L) 06/04/2017   LDLCALC 59 06/04/2017   LDLDIRECT 95.0 08/22/2016   TRIG 164.0 (H) 06/04/2017   CHOLHDL 5 06/04/2017  - Continues atorvastatin and fish oil without side effects.   Carlus Pavlov, MD PhD [facility]

## 2018-05-13 ENCOUNTER — Other Ambulatory Visit: Payer: Self-pay | Admitting: Internal Medicine

## 2018-05-17 ENCOUNTER — Other Ambulatory Visit: Payer: Self-pay | Admitting: Internal Medicine

## 2018-06-09 ENCOUNTER — Other Ambulatory Visit: Payer: Self-pay | Admitting: Internal Medicine

## 2018-06-13 ENCOUNTER — Other Ambulatory Visit: Payer: Self-pay | Admitting: Internal Medicine

## 2018-06-17 ENCOUNTER — Other Ambulatory Visit: Payer: Self-pay | Admitting: Internal Medicine

## 2018-06-19 ENCOUNTER — Telehealth: Payer: Self-pay | Admitting: Family Medicine

## 2018-06-19 NOTE — Telephone Encounter (Signed)
Called and left patient a VM advising of Dr. Leandro ReasonerShaws leave for September and October. I let her know that we could either schedule her with a different provider or she could wait until November to see Dr. Clelia CroftShaw.   When patient calls back, please reschedule her for an  OV: transfer of care from [place].  Thank you!

## 2018-06-23 ENCOUNTER — Telehealth: Payer: Self-pay | Admitting: Family Medicine

## 2018-07-06 ENCOUNTER — Other Ambulatory Visit: Payer: Self-pay | Admitting: Internal Medicine

## 2018-07-28 ENCOUNTER — Ambulatory Visit: Payer: BLUE CROSS/BLUE SHIELD | Admitting: Family Medicine

## 2018-08-01 ENCOUNTER — Other Ambulatory Visit: Payer: Self-pay | Admitting: Internal Medicine

## 2018-08-23 ENCOUNTER — Other Ambulatory Visit: Payer: Self-pay | Admitting: Internal Medicine

## 2018-08-24 NOTE — Telephone Encounter (Signed)
Gherghe patient last seen 6 months ago

## 2018-09-04 ENCOUNTER — Other Ambulatory Visit: Payer: Self-pay | Admitting: Internal Medicine

## 2018-09-05 ENCOUNTER — Other Ambulatory Visit: Payer: Self-pay | Admitting: Internal Medicine

## 2018-09-07 ENCOUNTER — Ambulatory Visit: Payer: BLUE CROSS/BLUE SHIELD | Admitting: Internal Medicine

## 2018-09-07 ENCOUNTER — Encounter: Payer: Self-pay | Admitting: Internal Medicine

## 2018-09-07 VITALS — BP 110/60 | HR 82 | Ht 67.32 in | Wt 156.0 lb

## 2018-09-07 DIAGNOSIS — IMO0001 Reserved for inherently not codable concepts without codable children: Secondary | ICD-10-CM

## 2018-09-07 DIAGNOSIS — E1165 Type 2 diabetes mellitus with hyperglycemia: Secondary | ICD-10-CM | POA: Diagnosis not present

## 2018-09-07 DIAGNOSIS — E785 Hyperlipidemia, unspecified: Secondary | ICD-10-CM

## 2018-09-07 DIAGNOSIS — R5383 Other fatigue: Secondary | ICD-10-CM | POA: Diagnosis not present

## 2018-09-07 DIAGNOSIS — Z23 Encounter for immunization: Secondary | ICD-10-CM | POA: Diagnosis not present

## 2018-09-07 DIAGNOSIS — E039 Hypothyroidism, unspecified: Secondary | ICD-10-CM

## 2018-09-07 LAB — LIPID PANEL
CHOLESTEROL: 125 mg/dL (ref 0–200)
HDL: 29.1 mg/dL — ABNORMAL LOW (ref >39.00)
LDL Cholesterol: 61 mg/dL (ref 0–99)
NONHDL: 95.82
Total CHOL/HDL Ratio: 4
Triglycerides: 172 mg/dL — ABNORMAL HIGH (ref 0.0–149.0)
VLDL: 34.4 mg/dL (ref 0.0–40.0)

## 2018-09-07 LAB — POCT GLYCOSYLATED HEMOGLOBIN (HGB A1C): Hemoglobin A1C: 6.1 % — AB (ref 4.0–5.6)

## 2018-09-07 LAB — T4, FREE: Free T4: 0.72 ng/dL (ref 0.60–1.60)

## 2018-09-07 LAB — MICROALBUMIN / CREATININE URINE RATIO
CREATININE, U: 68.6 mg/dL
MICROALB/CREAT RATIO: 2.7 mg/g (ref 0.0–30.0)
Microalb, Ur: 1.9 mg/dL (ref 0.0–1.9)

## 2018-09-07 LAB — TSH: TSH: 4.32 u[IU]/mL (ref 0.35–4.50)

## 2018-09-07 MED ORDER — METFORMIN HCL 1000 MG PO TABS: ORAL_TABLET | ORAL | 3 refills | Status: DC

## 2018-09-07 MED ORDER — DULAGLUTIDE 1.5 MG/0.5ML ~~LOC~~ SOAJ: SUBCUTANEOUS | 3 refills | Status: DC

## 2018-09-07 MED ORDER — LEVOTHYROXINE SODIUM 75 MCG PO TABS: ORAL_TABLET | ORAL | 3 refills | Status: DC

## 2018-09-07 MED ORDER — CANAGLIFLOZIN 100 MG PO TABS: ORAL_TABLET | ORAL | 3 refills | Status: AC

## 2018-09-07 MED ORDER — ATORVASTATIN CALCIUM 20 MG PO TABS: ORAL_TABLET | ORAL | 3 refills | Status: DC

## 2018-09-07 MED ORDER — ONETOUCH DELICA LANCETS 33G MISC: 3 refills | Status: AC

## 2018-09-07 MED ORDER — INSULIN DETEMIR 100 UNIT/ML FLEXPEN: PEN_INJECTOR | SUBCUTANEOUS | 11 refills | Status: DC

## 2018-09-07 MED ORDER — GLUCOSE BLOOD VI STRP: ORAL_STRIP | 5 refills | Status: AC

## 2018-09-07 MED ORDER — GLIPIZIDE 5 MG PO TABS: ORAL_TABLET | ORAL | 1 refills | Status: DC

## 2018-09-07 NOTE — Patient Instructions (Signed)
Please continue: - Metformin 1000 mg 2x a day with meal - Invokana 100 mg daily in am - Trulicity 1.5 mg weekly - Levemir 25 units at bedtime - Glipizide 5 mg  before large meal  Check sugars 1-2x a day, rotating check times.  Please call and schedule an eye appt with Dr. Randon GoldsmithLyles: [facility]:  Dr. Jeni SallesGraham W. Lyles MD ?  Address: [address] [place], [place], [place] [address]  [phone]  Please continue Levothyroxine 50 mcg daily.  Take the thyroid hormone every day, with water, at least 30 minutes before breakfast, separated by at least 4 hours from: - acid reflux medications - calcium - iron - multivitamins  Please stop at the lab.  Please come back for a follow-up appointment in 4 months.

## 2018-09-07 NOTE — Progress Notes (Signed)
Patient ID: Raymond Fitzgerald, male   DOB: 09-Apr-1971, 47 y.o.   MRN: [id number]  HPI: Raymond Fitzgerald is a 47 y.o.-year-old male, returning for f/u for DM2, dx 2011, insulin-dependent since 2014, uncontrolled, without long term complications and also hypothyroidism. He moved from [place]. Maisie Fus in 07/2014.  PCP: Dr. Pershing Proud. Last visit 8 months ago.  His last visit, he was admitted for gall bladder sx  >> this was necrotic >> sepsis 01/2018. He was in the hospital for 1 week.  DM2: Last hemoglobin A1c was: Lab Results  Component Value Date   HGBA1C 6.9 01/02/2018   HGBA1C 6.4 09/04/2017   HGBA1C 5.9 06/04/2017  10/2014: HbA1c 9.5% 05/2014: HbA1c 8.5%  He is on: - Metformin 1000 mg 2x a day with meal - Invokana 100 mg daily in am (ran out) - Trulicity 1.5 mg weekly - Levemir 25 units at bedtime - Glipizide 5 mg  before large dinner (lunch) - only with heavy meals He was on Bydureon 2 mg weekly >> nodules under skin at inj sites >> Tanzeum >> Victoza 1.8 mg daily >> not covered  Pt checks his sugars 0-1X a day: - am: 135 >> 128 >> 110-130 >> n/c - 2h after b'fast:  79, 106-146 >> 147 >> n/c >> 140-150 - before lunch: 77-108 >> 86, 92 >> n/c - 2h after lunch: 160, 175 >> 130-180 >> 180 - before dinner:125 >> 109 >> 110-150 >> 130 - 2h after dinner: 185-195 (icecream) >> n/c - bedtime: n/c >> 150-160 >> n/c - nighttime: n/c Lowest sugar was 64 >> 110 >> 90; he has hypoglycemia awareness in the 70s. Highest sugar was 175 >> 180 (sweets) >> 190.  Meter: GE (not covered by [facility]).  Pt's meals are: - Breakfast: tea (unsweet) + cookies >> 12 grain bread + butter + tea  - Lunch: tortilla + vegetables, chicken/fish/seafood - Dinner: same - Snacks: 2 : protein bars mid-pm; post dinner: fruit, bar Occasionally cinnamon water >> not lately  No CKD, last BUN/creatinine:  Lab Results  Component Value Date   BUN 11 01/29/2018   CREATININE 0.90 01/29/2018  Not on a ACE  inhibitor/ARB  No MAU: Lab Results  Component Value Date   MICRALBCREAT 1.0 06/04/2017    HL; last set of lipids: Lab Results  Component Value Date   CHOL 117 06/04/2017   HDL 25.00 (L) 06/04/2017   LDLCALC 59 06/04/2017   LDLDIRECT 95.0 08/22/2016   TRIG 164.0 (H) 06/04/2017   CHOLHDL 5 06/04/2017  On Lipitor 20, occasional fish oil. - last eye exam was in 05/2016: No DR -No numbness and tingling in his feet.  + Occasional muscle cramps.  He has hypothyroidism.   Pt is on levothyroxine 50 Mcg daily, taken: - in am - fasting - at least 30 min from b'fast - no Ca, Fe, PPIs, MVI - not on Biotin  Latest TSH level was high: Lab Results  Component Value Date   TSH 4.622 (H) 01/27/2018   Pt denies: - feeling nodules in neck - hoarseness - dysphagia - choking - SOB with lying down  ROS: Constitutional: no weight gain/no weight loss, no fatigue, no subjective hyperthermia, no subjective hypothermia Eyes: no blurry vision, no xerophthalmia ENT: no sore throat, + see HPI Cardiovascular: no CP/no SOB/no palpitations/no leg swelling Respiratory: no cough/no SOB/no wheezing Gastrointestinal: no N/no V/no D/no C/no acid reflux Musculoskeletal: + muscle aches/no joint aches Skin: no rashes, no hair loss Neurological: no tremors/no numbness/no tingling/no  dizziness  I reviewed pt's medications, allergies, PMH, social hx, family hx, and changes were documented in the history of present illness. Otherwise, unchanged from my initial visit note.   Past Medical History:  Diagnosis Date  . Diabetes mellitus without complication (HCC)   . Hyperlipidemia   . Thyroid disease    Past Surgical History:  Procedure Laterality Date  . CHOLECYSTECTOMY N/A 01/28/2018   Procedure: LAPAROSCOPIC CHOLECYSTECTOMY;  Surgeon: Berna Bue, MD;  Location: [facility] OR;  Service: General;  Laterality: N/A;   History   Social History  . Marital Status: Married    Spouse Name: N/A    Number  of Children: 2   Occupational History  . Sales assistant   Social History Main Topics  . Smoking status: Never Smoker   . Smokeless tobacco: Not on file  . Alcohol Use: Yes  . Drug Use: No   Current Outpatient Medications  Medication Sig Dispense Refill  . albuterol (PROVENTIL HFA;VENTOLIN HFA) 108 (90 Base) MCG/ACT inhaler Inhale 2 puffs into the lungs every 6 (six) hours as needed for wheezing or shortness of breath. 1 Inhaler 0  . atorvastatin (LIPITOR) 20 MG tablet TAKE 1 TABLET(20 MG) BY MOUTH DAILY 90 tablet 3  . atorvastatin (LIPITOR) 20 MG tablet TAKE 1 TABLET(20 MG) BY MOUTH DAILY 90 tablet 0  . BD PEN NEEDLE NANO U/F 32G X 4 MM MISC USE ONCE DAILY 200 each 0  . Dulaglutide (TRULICITY) 1.5 MG/0.5ML SOPN Inject 1.5 mg under skin weekly (Patient taking differently: Inject 1.5 mg into the skin once a week. On Sunday) 12 pen 3  . glipiZIDE (GLUCOTROL) 5 MG tablet TAKE 1 TABLET(5 MG) BY MOUTH TWICE DAILY BEFORE A MEAL 180 tablet 0  . Insulin Detemir (LEVEMIR FLEXTOUCH) 100 UNIT/ML Pen INJECT 25 UNITS UNDER THE SKIN AT BEDTIME 15 mL 5  . INVOKANA 100 MG TABS tablet TAKE 1 TABLET BY MOUTH DAILY BEFORE BREAKFAST 90 tablet 0  . LEVEMIR FLEXTOUCH 100 UNIT/ML Pen INJECT 30 UNITS UNDER THE SKIN AT BEDTIME 15 mL 0  . levothyroxine (SYNTHROID, LEVOTHROID) 50 MCG tablet TAKE 1 TABLET(50 MCG) BY MOUTH DAILY BEFORE BREAKFAST 90 tablet 3  . meloxicam (MOBIC) 7.5 MG tablet Take 1 tablet (7.5 mg total) by mouth daily. 30 tablet 0  . metFORMIN (GLUCOPHAGE) 1000 MG tablet TAKE 1 TABLET(1000 MG) BY MOUTH TWICE DAILY WITH A MEAL 180 tablet 3  . ondansetron (ZOFRAN ODT) 4 MG disintegrating tablet Take 1 tablet (4 mg total) by mouth every 8 (eight) hours as needed for nausea or vomiting. (Patient not taking: Reported on 02/20/2018) 10 tablet 0  . ONETOUCH VERIO test strip USE TO TEST BLOOD SUGAR TWICE DAILY AS DIRECTED 200 each 5  . TRULICITY 1.5 MG/0.5ML SOPN INJECT 1.5 MG UNDER THE SKIN WEEKLY 2 mL 0    No current facility-administered medications for this visit.    Allergies  Allergen Reactions  . Penicillins Hives and Swelling   FH: - see HPI - HL in M, F - cancer in M  PE: BP 110/60   Pulse 82   Ht 5' 7.32" (1.71 m) Comment: measured  Wt 156 lb (70.8 kg)   SpO2 97%   BMI 24.20 kg/m   Wt Readings from Last 3 Encounters:  09/07/18 156 lb (70.8 kg)  02/20/18 153 lb (69.4 kg)  01/27/18 169 lb 12.1 oz (77 kg)   Constitutional: overweight, in NAD Eyes: PERRLA, EOMI, no exophthalmos ENT: moist mucous membranes, no thyromegaly, no  cervical lymphadenopathy Cardiovascular: RRR, No MRG Respiratory: CTA B Gastrointestinal: abdomen soft, NT, ND, BS+ Musculoskeletal: no deformities, strength intact in all 4 Skin: moist, warm, no rashes Neurological: no tremor with outstretched hands, DTR normal in all 4  ASSESSMENT: 1. DM2, insulin-dependent, uncontrolled, without long term complications  2. Hypothyroidism  3. HL  4. Fatigue  PLAN:  1. Patient with previously uncontrolled diabetes, with improved control after adding a GLP-1 receptor agonist.  We were able to decrease his insulin dose after this.  I also advised him to use glipizide only before large meals.  At last visit, sugars were at goal in the morning and it was slightly high after lunch and before dinner.  We discussed about taking glipizide before lunch and also discussed about changing what he ate for lunch.  We also discussed how to reduce his insulin resistance as he was telling me that he was interested in coming off insulin. -At today's visit, patient is not checking sugars consistently and I strongly advised him to start doing some.  He is ideally checking 1-2 times a day.  I also advised him to check to see if we can de-escalate his diabetic regimen.  He agrees to start checking his once a day. -Sugars still appear slightly high after lunch, but he is not taking his glipizide consistently before lunch. -I  refilled all of his diabetic medicines today -We will also check his lipids and ACR today. - advised him to: Patient Instructions  Please continue: - Metformin 1000 mg 2x a day with meal - Invokana 100 mg daily in am - Trulicity 1.5 mg weekly - Levemir 25 units at bedtime - Glipizide 5 mg  before large dinner (lunch)  Please call and schedule an eye appt with Dr. Randon GoldsmithLyles: [facility]:  Dr. Jeni SallesGraham W. Lyles MD ?  Address: [address], [place], [place] [address]  [phone]  Please continue Levothyroxine 50 mcg daily.  Take the thyroid hormone every day, with water, at least 30 minutes before breakfast, separated by at least 4 hours from: - acid reflux medications - calcium - iron - multivitamins  Please come back for a follow-up appointment in 4 months.  - today, HbA1c is 6.1% (better) - continue checking sugars at different times of the day - check 1x a day, rotating checks - advised for yearly eye exams >> he is not UTD - + flu and PNA shot today  - Return to clinic in 4 mo with sugar log    2. Hypothyroidism - latest thyroid labs reviewed with pt >> slightly high TSH 01/2018 but I was not aware of this result: Lab Results  Component Value Date   TSH 4.622 (H) 01/27/2018  - he continues on LT4 50 mcg daily - pt feels good on this dose. - we discussed about taking the thyroid hormone every day, with water, >30 minutes before breakfast, separated by >4 hours from acid reflux medications, calcium, iron, multivitamins. Pt. is taking it correctly. - will check thyroid tests today: TSH and fT4 - If labs are abnormal, he will need to return for repeat TFTs in 1.5 months  3. HL - Reviewed latest lipid panel from 05/2017: LDL at goal, much improved, but still high triglycerides and low HDL Lab Results  Component Value Date   CHOL 117 06/04/2017   HDL 25.00 (L) 06/04/2017   LDLCALC 59 06/04/2017   LDLDIRECT 95.0 08/22/2016   TRIG 164.0 (H)  06/04/2017   CHOLHDL 5  06/04/2017  - Continues atorvastatin and fish oil without side effects.  4.  Fatigue -He would like me to check a vitamin D level.  Will order.  Component     Latest Ref Rng & Units 09/07/2018  Cholesterol     0 - 200 mg/dL 756  Triglycerides     0.0 - 149.0 mg/dL 433.2 (H)  HDL Cholesterol     >39.00 mg/dL 95.18 (L)  VLDL     0.0 - 40.0 mg/dL 84.1  LDL (calc)     0 - 99 mg/dL 61  Total CHOL/HDL Ratio      4  NonHDL      95.82  Microalb, Ur     0.0 - 1.9 mg/dL 1.9  Creatinine,U     mg/dL 66.0  MICROALB/CREAT RATIO     0.0 - 30.0 mg/g 2.7  TSH     0.35 - 4.50 uIU/mL 4.32  T4,Free(Direct)     0.60 - 1.60 ng/dL 6.30   Normal TFTs.  However, due to fatigue, I would suggest to increase the dose to 75 mcg daily and have him back for repeat labs in 2 months. Normal ACR. LDL at goal, triglycerides slightly high. Vit D pending.  Carlus Pavlov, MD PhD [facility]

## 2018-09-07 NOTE — Addendum Note (Signed)
Addended by: Adline MangoSTONE-ELMORE, Kenadee Gates I on: 09/07/2018 04:17 PM   Modules accepted: Orders

## 2018-09-08 LAB — VITAMIN D 25 HYDROXY (VIT D DEFICIENCY, FRACTURES): VITD: 24.17 ng/mL — ABNORMAL LOW (ref 30.00–100.00)

## 2018-09-09 ENCOUNTER — Encounter: Payer: Self-pay | Admitting: Internal Medicine

## 2018-09-09 LAB — HM DIABETES EYE EXAM

## 2018-09-09 NOTE — Telephone Encounter (Signed)
MyChart message left.

## 2018-10-16 ENCOUNTER — Telehealth: Payer: Self-pay | Admitting: Internal Medicine

## 2018-10-16 MED ORDER — LEVOTHYROXINE SODIUM 75 MCG PO TABS: ORAL_TABLET | ORAL | 3 refills | Status: DC

## 2018-10-16 NOTE — Telephone Encounter (Signed)
Refill sent.

## 2018-10-16 NOTE — Telephone Encounter (Signed)
MEDICATION: Levothyroxine  PHARMACY:  [facility]  IS THIS A 90 DAY SUPPLY :   IS PATIENT OUT OF MEDICATION: no  IF NOT; HOW MUCH IS LEFT: 2--3 days  LAST APPOINTMENT DATE: @12 /11/2017  NEXT APPOINTMENT DATE:@4 /06/2019  DO WE HAVE YOUR PERMISSION TO LEAVE A DETAILED MESSAGE:  OTHER COMMENTS:    **Let patient know to contact pharmacy at the end of the day to make sure medication is ready. **  ** Please notify patient to allow 48-72 hours to process**  **Encourage patient to contact the pharmacy for refills or they can request refills through [facility]**

## 2018-10-22 ENCOUNTER — Other Ambulatory Visit: Payer: Self-pay | Admitting: Internal Medicine

## 2018-10-28 ENCOUNTER — Other Ambulatory Visit: Payer: Self-pay

## 2018-10-28 ENCOUNTER — Telehealth: Payer: Self-pay | Admitting: Internal Medicine

## 2018-10-28 MED ORDER — LEVOTHYROXINE SODIUM 75 MCG PO TABS: ORAL_TABLET | ORAL | 3 refills | Status: DC

## 2018-10-28 NOTE — Telephone Encounter (Signed)
RX sent

## 2018-10-28 NOTE — Telephone Encounter (Signed)
levothyroxine (SYNTHROID, LEVOTHROID) 75 MCG tablet   Patient stated that the pharmacy did not receive this medication and needs it resent

## 2018-11-16 ENCOUNTER — Telehealth: Payer: Self-pay | Admitting: Internal Medicine

## 2018-11-16 NOTE — Telephone Encounter (Signed)
Patient stated he is unable to come to our office because we are not in network with his insurance. He would like to see if Dr Raymond Fitzgerald could place a referral for him to be seen at [facility]   Please advise   Deretha Emory phone and fax [phone] Ph [phone] Fax

## 2018-11-17 ENCOUNTER — Other Ambulatory Visit: Payer: Self-pay | Admitting: Internal Medicine

## 2018-11-17 DIAGNOSIS — E039 Hypothyroidism, unspecified: Secondary | ICD-10-CM

## 2018-11-17 DIAGNOSIS — E1165 Type 2 diabetes mellitus with hyperglycemia: Principal | ICD-10-CM

## 2018-11-17 DIAGNOSIS — IMO0001 Reserved for inherently not codable concepts without codable children: Secondary | ICD-10-CM

## 2018-11-17 NOTE — Telephone Encounter (Signed)
Done

## 2018-11-18 NOTE — Telephone Encounter (Signed)
All done

## 2018-11-24 ENCOUNTER — Telehealth: Payer: Self-pay | Admitting: Internal Medicine

## 2018-11-24 NOTE — Telephone Encounter (Signed)
Patient called re: patient's insurance told patient Dr. Elvera Lennox is out of network. Patient requests a referral to Dr. Lenis Dickinson at [facility] fax# [phone]. Please call patient at ph# [phone] once referral has been sent.

## 2018-11-29 ENCOUNTER — Other Ambulatory Visit: Payer: Self-pay | Admitting: Internal Medicine

## 2018-12-01 ENCOUNTER — Other Ambulatory Visit: Payer: Self-pay | Admitting: Internal Medicine

## 2018-12-14 ENCOUNTER — Other Ambulatory Visit: Payer: Self-pay | Admitting: Internal Medicine

## 2019-01-14 ENCOUNTER — Ambulatory Visit: Payer: Self-pay | Admitting: Internal Medicine

## 2019-02-18 IMAGING — CT CT ABD-PELV W/ CM
2 of 5 series · 15 of 46 positions shown, 17 images · IV contrast (Omni 300)
Comparison: None.

CLINICAL DATA: Nausea, vomiting and abdominal pain.

EXAM:
CT ABDOMEN AND PELVIS WITH CONTRAST
TECHNIQUE: Multidetector CT imaging of the abdomen and pelvis was performed
using the standard protocol following bolus administration of
intravenous contrast.
CONTRAST:  100mL 070HYR-4WW IOPAMIDOL (070HYR-4WW) INJECTION 61%

[Series 3: a/p w/ 5mm · axial · 0.74mm/px · z∈[-623,-153]mm · 12 of 106 slices shown, 14 images]
[im 6/106  soft-tissue]
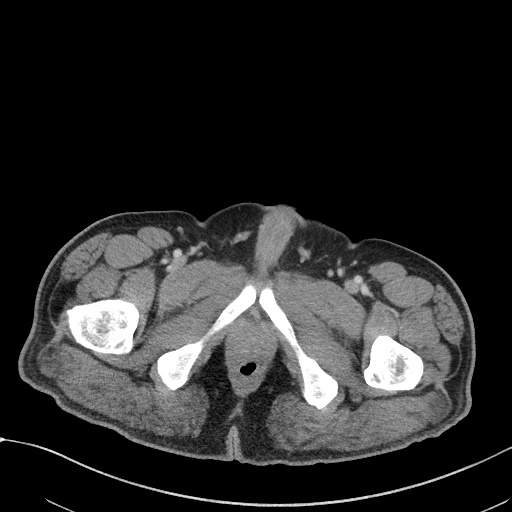
[im 6/106  bone]
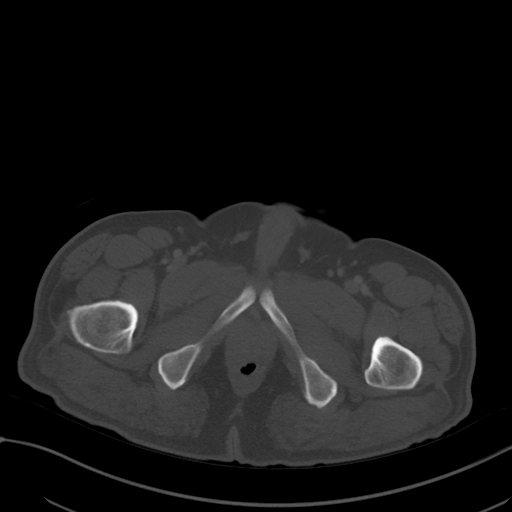
[im 16/106  soft-tissue]
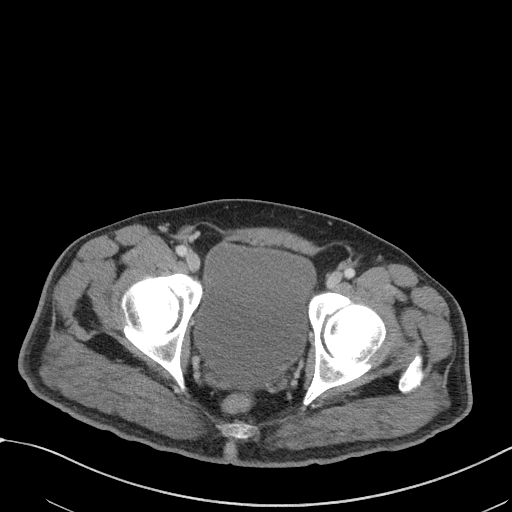
[im 22/106  soft-tissue]
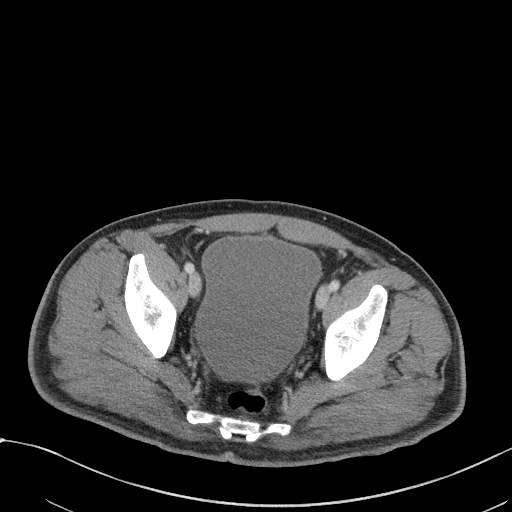
[im 32/106  soft-tissue]
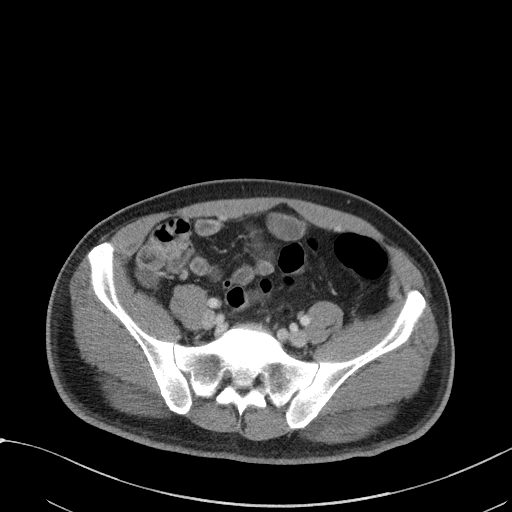
[im 43/106  soft-tissue]
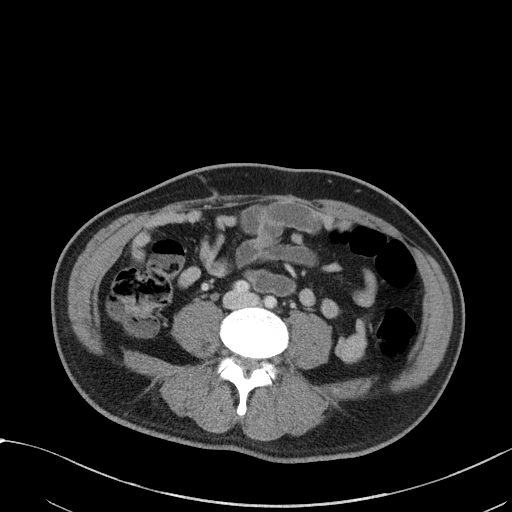
[im 48/106  soft-tissue]
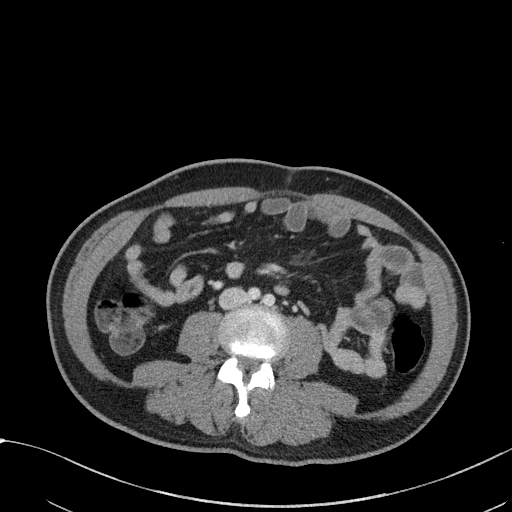
[im 58/106  soft-tissue]
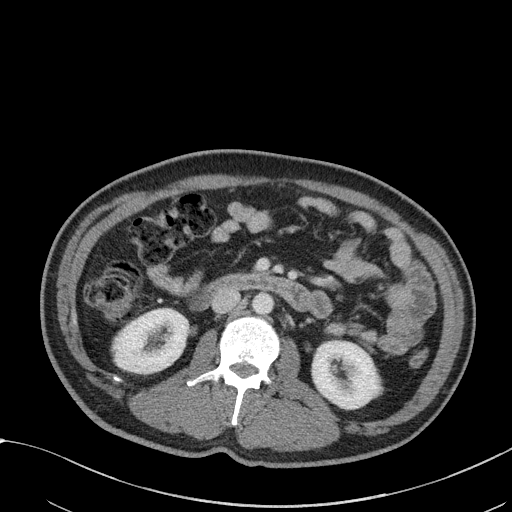
[im 64/106  soft-tissue]
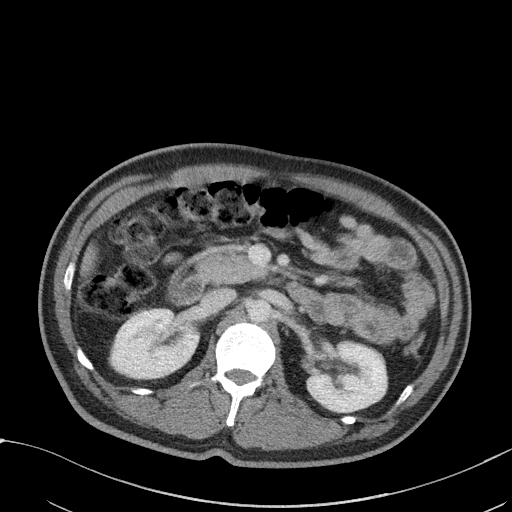
[im 74/106  soft-tissue]
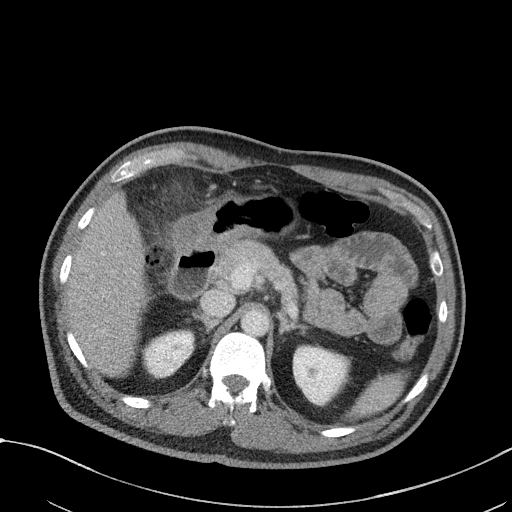
[im 74/106  bone]
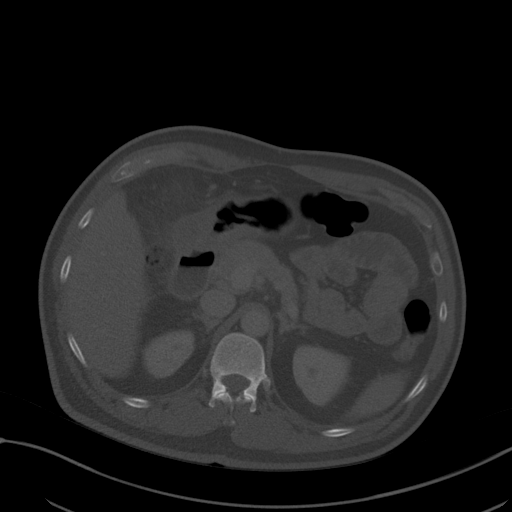
[im 85/106  soft-tissue]
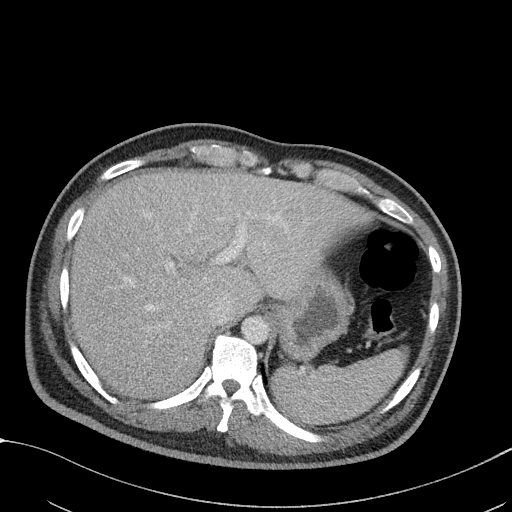
[im 90/106  soft-tissue]
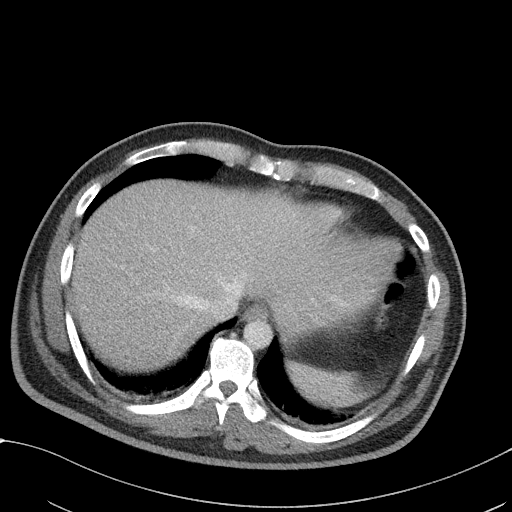
[im 100/106  soft-tissue]
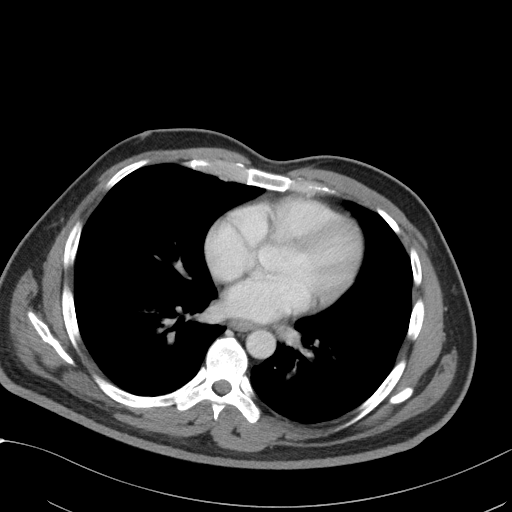

[Series 6: a/p w/ cor · coronal · 0.69mm/px · 3 of 121 slices shown]
[im 41/121  soft-tissue]
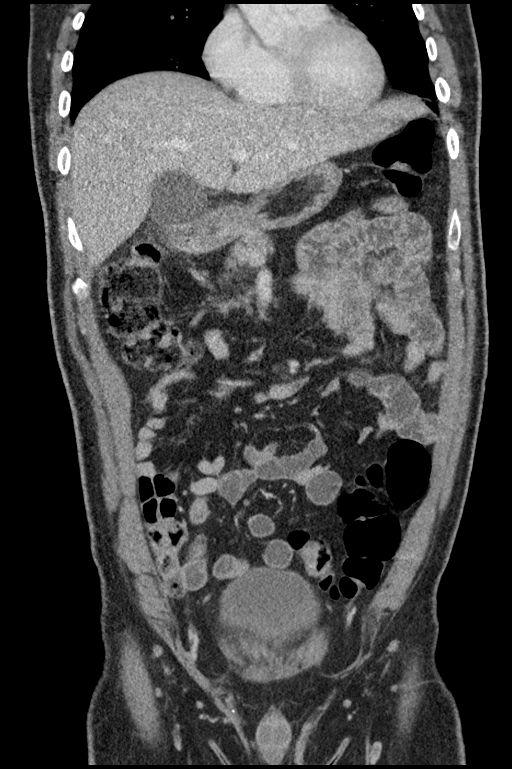
[im 54/121  soft-tissue]
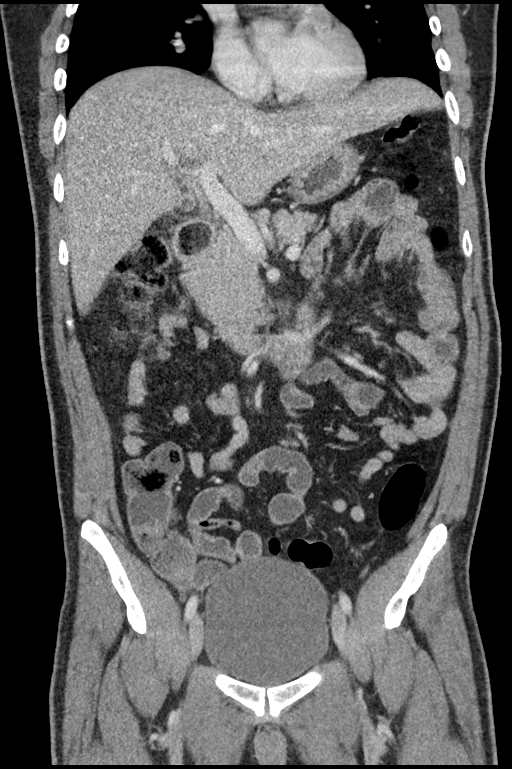
[im 67/121  soft-tissue]
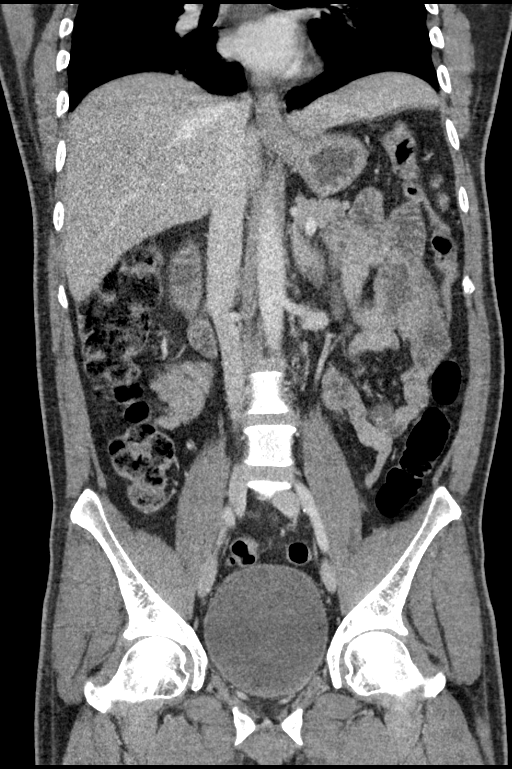

[15 of 46 positions shown; findings below may reference images not displayed]

FINDINGS: Lower chest: Normal size heart. No pericardial effusion. Bibasilar
atelectasis. No pneumothorax or pleural effusion.

Hepatobiliary: The gallbladder appears distended without wall
thickening. There is a 4 mm calculus without obstruction noted.
Right upper quadrant mesenteric edema and pericholecystic edema is
seen. It is uncertain whether this is secondary to a cholecystitis
versus distal gastritis/peptic ulcer disease, less likely duodenitis
given lack of duodenal thickening.

Pancreas: The pancreas appears unremarkable without ductal
dilatation.

Spleen: Normal in size without focal abnormality.

Adrenals/Urinary Tract: Normal bilateral adrenal glands. Symmetric
enhancement of both kidneys. No nephrolithiasis nor obstructive
uropathy. No hydroureteronephrosis. The bladder is distended without
focal mural thickening or calculi.

Stomach/Bowel: The gastric antrum and pyloric region appears
somewhat thickened and edematous. Right upper quadrant mesenteric
edema is also noted. Possibility of peptic ulcer disease and/or
gastritis is raised. No bowel obstruction. Mild fluid-filled
distention of proximal jejunum.

Vascular/Lymphatic: No significant vascular findings are present. No
enlarged abdominal or pelvic lymph nodes.

Reproductive: Prostate is unremarkable.

Other: Mild herniation of properitoneal fat within the proximal
inguinal canals bilaterally.

Musculoskeletal: No acute or significant osseous findings.
IMPRESSION: 1. The gallbladder is distended and contains a 4 mm calculus. There
is mild soft tissue edema in the right upper quadrant. Differential
possibilities may include changes from acute cholecystitis, peptic
ulcer disease/gastritis, less likely duodenitis. A HIDA scan may
help for further correlation.
2. Mild fluid-filled distention of proximal jejunal loops may also
represent sympathetic distention/ileus versus mild small bowel
enteritis. No mechanical bowel obstruction.

## 2019-02-18 IMAGING — NM NM HEPATOBILIARY IMAGE, INC GB
2 series · 12 of 12 positions shown · non-contrast
Comparison: 01/27/2018 ultrasound and CT.

CLINICAL DATA: 47-year-old male with right upper quadrant pain,
nausea and vomiting. Gallstones. Subsequent encounter.

EXAM:
NUCLEAR MEDICINE HEPATOBILIARY IMAGING
TECHNIQUE: Sequential images of the abdomen were obtained [DATE] minutes
following intravenous administration of radiopharmaceutical. 3 mg of
morphine administered an additional 60 minutes of images obtained
RADIOPHARMACEUTICALS:  5.1 mCi 1c-TTm  Choletec IV

[he hepatobiliary · 3.10mm/px · 6 of 60 frames shown (1 of 2)]
[frame 6/60]
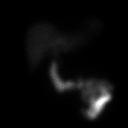
[frame 16/60]
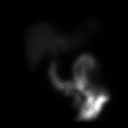
[frame 26/60]
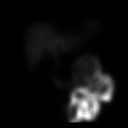
[frame 36/60]
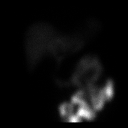
[frame 46/60]
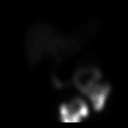
[frame 56/60]
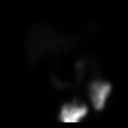

[he hepatobiliary · 3.10mm/px · 6 of 60 frames shown (2 of 2)]
[frame 6/60]
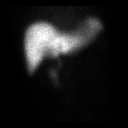
[frame 16/60]
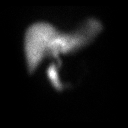
[frame 26/60]
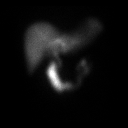
[frame 36/60]
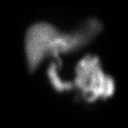
[frame 46/60]
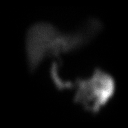
[frame 56/60]
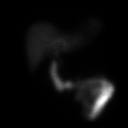

[12 of 12 positions shown; findings below may reference images not displayed]

FINDINGS: Prompt uptake and biliary excretion of activity by the liver is
seen.

Biliary activity passes into small bowel, consistent with patent
common bile duct.

Gallbladder not visualized on the first hour. 3 mg of morphine
administered and additional hour of imaging performed and
gallbladder not visualized.
IMPRESSION: Gallbladder not visualized despite administration of morphine.
Cystic duct therefore may be obstructed. In the proper clinical
setting, findings may reflect changes of acute cholecystitis.

These results will be called to the ordering clinician or
representative by the Radiologist Assistant, and communication
documented in the PACS or zVision Dashboard.

## 2019-02-26 ENCOUNTER — Other Ambulatory Visit: Payer: Self-pay | Admitting: Internal Medicine

## 2019-07-07 ENCOUNTER — Other Ambulatory Visit: Payer: Self-pay | Admitting: Internal Medicine

## 2019-09-01 ENCOUNTER — Other Ambulatory Visit: Payer: Self-pay | Admitting: Internal Medicine

## 2019-09-20 ENCOUNTER — Other Ambulatory Visit: Payer: Self-pay

## 2019-09-20 MED ORDER — LEVEMIR FLEXTOUCH 100 UNIT/ML ~~LOC~~ SOPN: PEN_INJECTOR | SUBCUTANEOUS | 11 refills | Status: AC

## 2019-11-07 ENCOUNTER — Other Ambulatory Visit: Payer: Self-pay | Admitting: Internal Medicine

## 2019-11-20 ENCOUNTER — Other Ambulatory Visit: Payer: Self-pay | Admitting: Internal Medicine

## 2019-12-31 ENCOUNTER — Other Ambulatory Visit: Payer: Self-pay | Admitting: Internal Medicine

## 2020-05-10 ENCOUNTER — Other Ambulatory Visit: Payer: Self-pay | Admitting: Internal Medicine

## 2020-08-09 ENCOUNTER — Other Ambulatory Visit: Payer: Self-pay | Admitting: Internal Medicine
